# Patient Record
Sex: Male | Born: 1995 | Race: Black or African American | Hispanic: No | Marital: Single | Smoking: Current some day smoker
Health system: Southern US, Community
[De-identification: ages and names within clinical notes are randomized; demographics above are authoritative.]

## PROBLEM LIST (undated history)

## (undated) ENCOUNTER — Emergency Department (HOSPITAL_COMMUNITY): Disposition: A | Discharge status: Home / Self Care | Payer: Medicaid Other

## (undated) DIAGNOSIS — R42 Dizziness and giddiness: Secondary | ICD-10-CM

## (undated) DIAGNOSIS — R011 Cardiac murmur, unspecified: Secondary | ICD-10-CM

## (undated) DIAGNOSIS — R002 Palpitations: Secondary | ICD-10-CM

---
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
## 2017-01-17 ENCOUNTER — Emergency Department (HOSPITAL_COMMUNITY)
Admission: EM | Admit: 2017-01-17 | Discharge: 2017-01-17 | Disposition: A | Discharge status: Home / Self Care | Payer: Medicaid Other | Attending: Emergency Medicine | Admitting: Emergency Medicine

## 2017-01-17 ENCOUNTER — Encounter (HOSPITAL_COMMUNITY): Payer: Self-pay | Admitting: Emergency Medicine

## 2017-01-17 ENCOUNTER — Other Ambulatory Visit: Payer: Self-pay

## 2017-01-17 DIAGNOSIS — Z5321 Procedure and treatment not carried out due to patient leaving prior to being seen by health care provider: Secondary | ICD-10-CM | POA: Insufficient documentation

## 2017-01-17 DIAGNOSIS — H9202 Otalgia, left ear: Secondary | ICD-10-CM | POA: Insufficient documentation

## 2017-01-17 NOTE — ED Triage Notes (Signed)
Pt states that he is having lt otalgia x 2 days.  States that the earache is causing him to have lt sided head pain.  Denies any other issues.

## 2017-05-17 ENCOUNTER — Emergency Department (HOSPITAL_COMMUNITY): Payer: Medicaid Other

## 2017-05-17 ENCOUNTER — Encounter (HOSPITAL_COMMUNITY): Payer: Self-pay | Admitting: Emergency Medicine

## 2017-05-17 ENCOUNTER — Other Ambulatory Visit: Payer: Self-pay

## 2017-05-17 DIAGNOSIS — Z79899 Other long term (current) drug therapy: Secondary | ICD-10-CM | POA: Insufficient documentation

## 2017-05-17 DIAGNOSIS — F17200 Nicotine dependence, unspecified, uncomplicated: Secondary | ICD-10-CM | POA: Diagnosis not present

## 2017-05-17 DIAGNOSIS — R002 Palpitations: Secondary | ICD-10-CM | POA: Diagnosis present

## 2017-05-17 LAB — CBC
HCT: 45.5 % (ref 39.0–52.0)
Hemoglobin: 15.2 g/dL (ref 13.0–17.0)
MCH: 29 pg (ref 26.0–34.0)
MCHC: 33.4 g/dL (ref 30.0–36.0)
MCV: 86.8 fL (ref 78.0–100.0)
PLATELETS: 171 10*3/uL (ref 150–400)
RBC: 5.24 MIL/uL (ref 4.22–5.81)
RDW: 13 % (ref 11.5–15.5)
WBC: 12.6 10*3/uL — AB (ref 4.0–10.5)

## 2017-05-17 NOTE — ED Notes (Signed)
This Clinical research associatewriter stuck pt x2 was unsuccessful. RN Elsie RaJeneen made aware.

## 2017-05-17 NOTE — ED Notes (Signed)
Bed: [facility] Expected date:  Expected time:  Means of arrival:  Comments: 

## 2017-05-17 NOTE — ED Triage Notes (Signed)
Patient started feeling heart rate raising fast about 30 minutes ago. Patient is running sinus tach. No chest pain but feels like he is going to past out.

## 2017-05-18 ENCOUNTER — Emergency Department (HOSPITAL_COMMUNITY)
Admission: EM | Admit: 2017-05-18 | Discharge: 2017-05-18 | Disposition: A | Discharge status: Home / Self Care | Payer: Medicaid Other | Attending: Emergency Medicine | Admitting: Emergency Medicine

## 2017-05-18 DIAGNOSIS — R002 Palpitations: Secondary | ICD-10-CM

## 2017-05-18 LAB — BASIC METABOLIC PANEL
Anion gap: 7 (ref 5–15)
BUN: 11 mg/dL (ref 6–20)
CHLORIDE: 107 mmol/L (ref 101–111)
CO2: 27 mmol/L (ref 22–32)
CREATININE: 0.94 mg/dL (ref 0.61–1.24)
Calcium: 9.3 mg/dL (ref 8.9–10.3)
GFR calc Af Amer: >60 mL/min (ref >60)
GFR calc non Af Amer: >60 mL/min (ref >60)
Glucose, Bld: 110 mg/dL — ABNORMAL HIGH (ref 65–99)
Potassium: 3.7 mmol/L (ref 3.5–5.1)
SODIUM: 141 mmol/L (ref 135–145)

## 2017-05-18 LAB — I-STAT TROPONIN, ED: Troponin i, poc: 0.04 ng/mL (ref 0.00–0.08)

## 2017-05-18 NOTE — ED Provider Notes (Signed)
[facility]-EMERGENCY DEPT Provider Note   CSN: [id number] Arrival date & time: 05/17/17  2153     History   Chief Complaint Chief Complaint  Patient presents with  . Palpitations    HPI Todd Mcguire is a 22 y.o. male.  Patient presents to the emergency department with a chief complaint of palpitations.  He reports having had palpitations earlier today while playing a video game.  He states that they have since resolved.  He denies using any stimulants.  He states that he has not had anything like this in his recent past, but when he was younger he used to pass out while playing basketball.  He denies any chest pain or shortness of breath.  Denies any other associated symptoms.  States that he feels normal now.  The history is provided by the patient. No language interpreter was used.    Past Medical History:  Diagnosis Date  . Heart murmur     There are no active problems to display for this patient.   History reviewed. No pertinent surgical history.      Home Medications    Prior to Admission medications   Medication Sig Start Date End Date Taking? Authorizing Provider  dicyclomine (BENTYL) 20 MG tablet Take 1 tablet (20 mg total) by mouth 2 (two) times daily. For abdominal discomfort 11/06/15   Arthor Captain, PA-C  docusate sodium (COLACE) 100 MG capsule Take 1 capsule (100 mg total) by mouth every 12 (twelve) hours. Patient taking differently: Take 100 mg by mouth daily as needed for mild constipation.  09/29/15   Santiago Glad, PA-C  lactobacillus (FLORANEX/LACTINEX) PACK Take 1 packet (1 g total) by mouth 2 (two) times daily. For healthy abdominal bacteria. 11/06/15   Harris, Abigail, PA-C  polyethylene glycol powder (GLYCOLAX/MIRALAX) powder Take 17 g by mouth daily. Patient taking differently: Take 17 g by mouth daily as needed for mild constipation.  10/12/15   Deborha Payment, PA-C    Family History History reviewed. No pertinent  family history.  Social History Social History   Tobacco Use  . Smoking status: Current Every Day Smoker  . Smokeless tobacco: Never Used  Substance Use Topics  . Alcohol use: No  . Drug use: Yes    Types: Marijuana    Comment: occ     Allergies   Flexeril [cyclobenzaprine] and Ibuprofen   Review of Systems Review of Systems  All other systems reviewed and are negative.    Physical Exam Updated Vital Signs BP 140/77 (BP Location: Left Arm)   Pulse (!) 105   Temp 98.3 F (36.8 C) (Oral)   Resp 20   Ht 5\' 10"  (1.778 m)   Wt 58.1 kg (128 lb)   SpO2 100%   BMI 18.37 kg/m   Physical Exam  Constitutional: He is oriented to person, place, and time. He appears well-developed and well-nourished.  HENT:  Head: Normocephalic and atraumatic.  Eyes: Pupils are equal, round, and reactive to light. Conjunctivae and EOM are normal. Right eye exhibits no discharge. Left eye exhibits no discharge. No scleral icterus.  Neck: Normal range of motion. Neck supple. No JVD present.  Cardiovascular: Normal rate, regular rhythm and normal heart sounds. Exam reveals no gallop and no friction rub.  No murmur heard. Pulmonary/Chest: Effort normal and breath sounds normal. No respiratory distress. He has no wheezes. He has no rales. He exhibits no tenderness.  Abdominal: Soft. He exhibits no distension and no mass. There is  no tenderness. There is no rebound and no guarding.  Musculoskeletal: Normal range of motion. He exhibits no edema or tenderness.  Neurological: He is alert and oriented to person, place, and time.  Skin: Skin is warm and dry.  Psychiatric: He has a normal mood and affect. His behavior is normal. Judgment and thought content normal.  Nursing note and vitals reviewed.    ED Treatments / Results  Labs (all labs ordered are listed, but only abnormal results are displayed) Labs Reviewed  BASIC METABOLIC PANEL - Abnormal; Notable for the following components:       Result Value   Glucose, Bld 110 (*)    All other components within normal limits  CBC - Abnormal; Notable for the following components:   WBC 12.6 (*)    All other components within normal limits  I-STAT TROPONIN, ED    EKG None ED ECG REPORT  I personally interpreted this EKG   Date: 05/18/2017   Rate: 99  Rhythm: normal sinus rhythm  QRS Axis: normal  Intervals: normal  ST/T Wave abnormalities: normal  Conduction Disutrbances:none  Narrative Interpretation: possible right ventricular hypertrophy  Old EKG Reviewed: none available   Radiology Dg Chest 2 View  Result Date: 05/17/2017 CLINICAL DATA:  Palpitations.  Nausea. EXAM: CHEST - 2 VIEW COMPARISON:  09/29/2015 FINDINGS: The cardiomediastinal contours are normal. The lungs are clear. Pulmonary vasculature is normal. No consolidation, pleural effusion, or pneumothorax. No acute osseous abnormalities are seen. Previous rightward curvature of the spine has decreased in the interim. IMPRESSION: Unremarkable radiographs of the chest. Electronically Signed   By: Rubye OaksMelanie  Ehinger M.D.   On: 05/17/2017 23:21    Procedures Procedures (including critical care time)  Medications Ordered in ED Medications - No data to display   Initial Impression / Assessment and Plan / ED Course  I have reviewed the triage vital signs and the nursing notes.  Pertinent labs & imaging results that were available during my care of the patient were reviewed by me and considered in my medical decision making (see chart for details).     Patient with palpitations earlier this evening.  Symptoms have resolved.  Patient is symptom-free.  EKG shows sinus rhythm.  He is normal rate on the monitor.  He is not having any symptoms now.  Laboratory workup is reassuring.  He denies having used any stimulants today.  He did smoke marijuana.  He used to pass out when he was younger while playing basketball, but denies having had this in the recent past.  I have  encouraged the patient to avoid anything strenuous and have advised him to follow-up with cardiology as he may need to wear a heart monitor.  Patient understands and agrees the plan.  Family members also agree with the plan.  Final Clinical Impressions(s) / ED Diagnoses   Final diagnoses:  Palpitations    ED Discharge Orders    None       Roxy HorsemanBrowning, Shaneque Merkle, PA-C 05/18/17 0124    Molpus, Jonny RuizJohn, MD 05/18/17 0750    Paula LibraMolpus, John, MD 05/18/17 [phone]

## 2017-05-18 NOTE — Discharge Instructions (Addendum)
Do NOT do anything strenuous that might elevate your heart rate until you are seen by the cardiologist.  Return for worsening symptoms or if you pass out.

## 2017-05-22 ENCOUNTER — Encounter (HOSPITAL_COMMUNITY): Payer: Self-pay

## 2017-05-22 ENCOUNTER — Other Ambulatory Visit: Payer: Self-pay

## 2017-05-22 ENCOUNTER — Emergency Department (HOSPITAL_COMMUNITY)
Admission: EM | Admit: 2017-05-22 | Discharge: 2017-05-22 | Disposition: A | Discharge status: Home / Self Care | Payer: Medicaid Other | Attending: Emergency Medicine | Admitting: Emergency Medicine

## 2017-05-22 DIAGNOSIS — Z79899 Other long term (current) drug therapy: Secondary | ICD-10-CM | POA: Diagnosis not present

## 2017-05-22 DIAGNOSIS — F17200 Nicotine dependence, unspecified, uncomplicated: Secondary | ICD-10-CM | POA: Diagnosis not present

## 2017-05-22 DIAGNOSIS — K5900 Constipation, unspecified: Secondary | ICD-10-CM

## 2017-05-22 DIAGNOSIS — R011 Cardiac murmur, unspecified: Secondary | ICD-10-CM | POA: Insufficient documentation

## 2017-05-22 DIAGNOSIS — R002 Palpitations: Secondary | ICD-10-CM

## 2017-05-22 DIAGNOSIS — R42 Dizziness and giddiness: Secondary | ICD-10-CM | POA: Insufficient documentation

## 2017-05-22 HISTORY — DX: Palpitations: R00.2

## 2017-05-22 HISTORY — DX: Dizziness and giddiness: R42

## 2017-05-22 LAB — I-STAT CHEM 8, ED
BUN: 13 mg/dL (ref 6–20)
CREATININE: 0.9 mg/dL (ref 0.61–1.24)
Calcium, Ion: 1.2 mmol/L (ref 1.15–1.40)
Chloride: 103 mmol/L (ref 101–111)
Glucose, Bld: 84 mg/dL (ref 65–99)
HEMATOCRIT: 50 % (ref 39.0–52.0)
Hemoglobin: 17 g/dL (ref 13.0–17.0)
POTASSIUM: 3.9 mmol/L (ref 3.5–5.1)
Sodium: 142 mmol/L (ref 135–145)
TCO2: 29 mmol/L (ref 22–32)

## 2017-05-22 MED ORDER — POLYETHYLENE GLYCOL 3350 17 GM/SCOOP PO POWD: 17.0000 g | Freq: Every day | ORAL | 0 refills | Status: DC

## 2017-05-22 NOTE — ED Triage Notes (Signed)
He states that as he attempted to go to the bathroom this morning, he felt "dizzy". He is in no distress.

## 2017-05-22 NOTE — ED Provider Notes (Signed)
[facility]-EMERGENCY DEPT Provider Note   CSN: [id number] Arrival date & time: 05/22/17  4098     History   Chief Complaint Chief Complaint  Patient presents with  . Dizziness  . Constipation    HPI Todd Mcguire is a 22 y.o. male.  HPI Patient is a 22 year old male who states that when he got up for work this morning he felt somewhat lightheaded.  No preceding palpitations.  No chest pain or shortness of breath.  Denies syncope.  He states his been eating and drinking normally over the past several days.  He reports he feels constipated over the past several days.  He is tried an over-the-counter stool softener without improvement in his symptoms.  Currently he is asymptomatic while lying in the bed.  He has no complaints at this time.  He is able to stand at the edge of the bed without any lightheadedness at this time.  No melena or hematochezia.    Past Medical History:  Diagnosis Date  . Heart murmur     There are no active problems to display for this patient.   No past surgical history on file.      Home Medications    Prior to Admission medications   Medication Sig Start Date End Date Taking? Authorizing Provider  dicyclomine (BENTYL) 20 MG tablet Take 1 tablet (20 mg total) by mouth 2 (two) times daily. For abdominal discomfort 11/06/15  Yes Harris, Abigail, PA-C  docusate sodium (COLACE) 100 MG capsule Take 1 capsule (100 mg total) by mouth every 12 (twelve) hours. Patient taking differently: Take 100 mg by mouth daily as needed for mild constipation.  09/29/15  Yes Santiago Glad, PA-C  fluticasone (FLONASE) 50 MCG/ACT nasal spray Place 2 sprays into the nose daily as needed. 01/18/17 01/18/18 Yes [provider]  lactobacillus (FLORANEX/LACTINEX) PACK Take 1 packet (1 g total) by mouth 2 (two) times daily. For healthy abdominal bacteria. 11/06/15  Yes Harris, Abigail, PA-C  polyethylene glycol powder (MIRALAX) powder Take 17 g  by mouth daily. 05/22/17   Azalia Bilis, MD    Family History No family history on file.  Social History Social History   Tobacco Use  . Smoking status: Current Every Day Smoker  . Smokeless tobacco: Never Used  Substance Use Topics  . Alcohol use: No  . Drug use: Yes    Types: Marijuana    Comment: occ     Allergies   Flexeril [cyclobenzaprine] and Ibuprofen   Review of Systems Review of Systems  All other systems reviewed and are negative.    Physical Exam Updated Vital Signs BP 136/82 (BP Location: Right Arm)   Pulse 62   Temp (!) 97.4 F (36.3 C) (Oral)   Resp 18   SpO2 100%   Physical Exam  Constitutional: He is oriented to person, place, and time. He appears well-developed and well-nourished.  HENT:  Head: Normocephalic and atraumatic.  Eyes: EOM are normal.  Neck: Normal range of motion.  Cardiovascular: Normal rate, regular rhythm, normal heart sounds and intact distal pulses.  Pulmonary/Chest: Effort normal and breath sounds normal. No respiratory distress.  Abdominal: Soft. He exhibits no distension. There is no tenderness.  Musculoskeletal: Normal range of motion.  Neurological: He is alert and oriented to person, place, and time.  Skin: Skin is warm and dry.  Psychiatric: He has a normal mood and affect. Judgment normal.  Nursing note and vitals reviewed.    ED Treatments /  Results  Labs (all labs ordered are listed, but only abnormal results are displayed) Labs Reviewed  I-STAT CHEM 8, ED    EKG None  Radiology No results found.  Procedures Procedures (including critical care time)  Medications Ordered in ED Medications - No data to display   Initial Impression / Assessment and Plan / ED Course  I have reviewed the triage vital signs and the nursing notes.  Pertinent labs & imaging results that were available during my care of the patient were reviewed by me and considered in my medical decision making (see chart for  details).     Hemoglobin stable at 17.   Feels much better at this time.  Potassium normal.  Other electrolytes without abnormality.  Primary care follow-up.  No indication for additional workup or treatment at this time.  Vital signs are stable.  Final Clinical Impressions(s) / ED Diagnoses   Final diagnoses:  Lightheadedness  Constipation, unspecified constipation type    ED Discharge Orders        Ordered    polyethylene glycol powder (MIRALAX) powder  Daily     05/22/17 1015       Azalia Bilisampos, Zamya Culhane, MD 05/22/17 1019

## 2017-05-23 ENCOUNTER — Encounter (HOSPITAL_COMMUNITY): Payer: Self-pay | Admitting: Emergency Medicine

## 2017-05-23 ENCOUNTER — Other Ambulatory Visit: Payer: Self-pay

## 2017-05-23 ENCOUNTER — Telehealth (HOSPITAL_COMMUNITY): Payer: Self-pay

## 2017-05-23 ENCOUNTER — Ambulatory Visit (HOSPITAL_COMMUNITY)
Admission: EM | Admit: 2017-05-23 | Discharge: 2017-05-23 | Disposition: A | Discharge status: Home / Self Care | Payer: Medicaid Other | Attending: Family Medicine | Admitting: Family Medicine

## 2017-05-23 DIAGNOSIS — F17200 Nicotine dependence, unspecified, uncomplicated: Secondary | ICD-10-CM | POA: Diagnosis not present

## 2017-05-23 DIAGNOSIS — R531 Weakness: Secondary | ICD-10-CM

## 2017-05-23 DIAGNOSIS — Z79899 Other long term (current) drug therapy: Secondary | ICD-10-CM | POA: Diagnosis not present

## 2017-05-23 DIAGNOSIS — Z7951 Long term (current) use of inhaled steroids: Secondary | ICD-10-CM | POA: Insufficient documentation

## 2017-05-23 DIAGNOSIS — F1290 Cannabis use, unspecified, uncomplicated: Secondary | ICD-10-CM | POA: Diagnosis not present

## 2017-05-23 LAB — TSH: TSH: 0.711 u[IU]/mL (ref 0.350–4.500)

## 2017-05-23 NOTE — ED Triage Notes (Signed)
Patient was in emergency department for racing heart.   Today patient complains of feeling bad in general.  Feeling tired, denies any episodes of heart racing.  Reports stomach pain.  Patient reports good results with laxative.

## 2017-05-23 NOTE — ED Provider Notes (Signed)
[facility]    CSN: [id number] Arrival date & time: 05/23/17  1421     History   Chief Complaint Chief Complaint  Patient presents with  . Weakness    HPI Todd Mcguire is a 22 y.o. male.   22 year old male, with no significant past medical history, presenting today with his dad due to weakness.  Dad states that they have been evaluated twice in the emergency department in the past few days for similar symptoms.  Patient was evaluated for palpitations a few days ago as well as lightheadedness yesterday.  Had reassuring work-ups both times with normal EKGs and labs.  Patient had an i-STAT done yesterday with normal electrolytes.  He does admit to smoking marijuana prior to his palpitations beginning during his previous ER visit.  Reports generalized weakness today.  Denies any complaints of pain, palpitations.  Has an appointment scheduled for primary care in the next month.  The history is provided by the patient.  Weakness  Primary symptoms include dizziness.  Primary symptoms include no focal weakness, no loss of balance, no speech change, no memory loss, no movement disorder. This is a new problem. The current episode started more than 2 days ago. The problem has not changed since onset.There was no focality noted. There has been no fever. Pertinent negatives include no shortness of breath, no chest pain, no vomiting, no altered mental status, no confusion and no headaches. There were no medications administered prior to arrival. Associated medical issues do not include trauma, mood changes, a bleeding disorder, seizures, dementia, CVA or a clotting disorder.    Past Medical History:  Diagnosis Date  . Dizziness 05/22/2017  . Heart murmur   . Palpitations 05/22/2017    Patient Active Problem List   Diagnosis Date Noted  . Palpitations 05/22/2017  . Dizziness 05/22/2017  . Heart murmur     History reviewed. No pertinent surgical history.     Home Medications     Prior to Admission medications   Medication Sig Start Date End Date Taking? Authorizing Provider  dicyclomine (BENTYL) 20 MG tablet Take 1 tablet (20 mg total) by mouth 2 (two) times daily. For abdominal discomfort 11/06/15   Arthor CaptainHarris, Abigail, PA-C  docusate sodium (COLACE) 100 MG capsule Take 1 capsule (100 mg total) by mouth every 12 (twelve) hours. Patient taking differently: Take 100 mg by mouth daily as needed for mild constipation.  09/29/15   Santiago GladLaisure, Heather, PA-C  fluticasone (FLONASE) 50 MCG/ACT nasal spray Place 2 sprays into the nose daily as needed. 01/18/17 01/18/18  [provider]  lactobacillus (FLORANEX/LACTINEX) PACK Take 1 packet (1 g total) by mouth 2 (two) times daily. For healthy abdominal bacteria. 11/06/15   Harris, Abigail, PA-C  polyethylene glycol powder (MIRALAX) powder Take 17 g by mouth daily. 05/22/17   Azalia Bilisampos, Kevin, MD    Family History No family history on file.  Social History Social History   Tobacco Use  . Smoking status: Current Every Day Smoker  . Smokeless tobacco: Never Used  Substance Use Topics  . Alcohol use: No  . Drug use: Yes    Types: Marijuana    Comment: occ     Allergies   Flexeril [cyclobenzaprine] and Ibuprofen   Review of Systems Review of Systems  Constitutional: Negative for chills and fever.  HENT: Negative for ear pain and sore throat.   Eyes: Negative for pain and visual disturbance.  Respiratory: Negative for cough and shortness of breath.  Cardiovascular: Negative for chest pain and palpitations.  Gastrointestinal: Negative for abdominal pain and vomiting.  Genitourinary: Negative for dysuria and hematuria.  Musculoskeletal: Negative for arthralgias and back pain.  Skin: Negative for color change and rash.  Neurological: Positive for dizziness, weakness and light-headedness. Negative for speech change, focal weakness, seizures, syncope, headaches and loss of balance.  Psychiatric/Behavioral: Negative for  confusion and memory loss.  All other systems reviewed and are negative.    Physical Exam Triage Vital Signs ED Triage Vitals  Enc Vitals Group     BP 05/23/17 1444 124/69     Pulse Rate 05/23/17 1444 (!) 57     Resp 05/23/17 1444 18     Temp 05/23/17 1444 98.7 F (37.1 C)     Temp Source 05/23/17 1444 Oral     SpO2 05/23/17 1444 100 %     Weight --      Height --      Head Circumference --      Peak Flow --      Pain Score 05/23/17 1443 5     Pain Loc --      Pain Edu? --      Excl. in GC? --    No data found.  Updated Vital Signs BP 124/69 (BP Location: Left Arm)   Pulse (!) 57   Temp 98.7 F (37.1 C) (Oral)   Resp 18   SpO2 100%   Visual Acuity Right Eye Distance:   Left Eye Distance:   Bilateral Distance:    Right Eye Near:   Left Eye Near:    Bilateral Near:     Physical Exam  Constitutional: He is oriented to person, place, and time. He appears well-developed and well-nourished.  HENT:  Head: Normocephalic and atraumatic.  Eyes: Conjunctivae are normal.  Neck: Neck supple.  Cardiovascular: Normal rate and regular rhythm.  No murmur heard. Pulmonary/Chest: Effort normal and breath sounds normal. No respiratory distress.  Abdominal: Soft. There is no tenderness.  Musculoskeletal: He exhibits no edema.       Lumbar back: He exhibits tenderness.  Neurological: He is alert and oriented to person, place, and time. He has normal strength and normal reflexes. No cranial nerve deficit or sensory deficit. He displays a negative Romberg sign. GCS eye subscore is 4. GCS verbal subscore is 5. GCS motor subscore is 6.  Skin: Skin is warm and dry.  Psychiatric: He has a normal mood and affect.  Nursing note and vitals reviewed.    UC Treatments / Results  Labs (all labs ordered are listed, but only abnormal results are displayed) Labs Reviewed  TSH    EKG None Radiology No results found.  Procedures Procedures (including critical care  time)  Medications Ordered in UC Medications - No data to display   Initial Impression / Assessment and Plan / UC Course  I have reviewed the triage vital signs and the nursing notes.  Pertinent labs & imaging results that were available during my care of the patient were reviewed by me and considered in my medical decision making (see chart for details).     Reviewed patient's labs and work-up from his previous 2 ED visits.  TSH added.  Recommended that he follow-up with his primary care physician at his upcoming scheduled appointment.  Recommended increase oral intake of fluids and rest.  Symptoms likely related to recent substance abuse.  Final Clinical Impressions(s) / UC Diagnoses   Final diagnoses:  Weakness  ED Discharge Orders    None       Controlled Substance Prescriptions [place] Controlled Substance Registry consulted? Not Applicable   Alecia Lemming, [place] 05/23/17 1514

## 2017-05-23 NOTE — Telephone Encounter (Signed)
Results are within normal range. Attempted to reach patient. No answer.

## 2017-05-24 ENCOUNTER — Encounter (HOSPITAL_COMMUNITY): Payer: Self-pay | Admitting: Emergency Medicine

## 2017-05-24 ENCOUNTER — Emergency Department (HOSPITAL_COMMUNITY)
Admission: EM | Admit: 2017-05-24 | Discharge: 2017-05-24 | Disposition: A | Discharge status: Home / Self Care | Payer: Medicaid Other | Attending: Emergency Medicine | Admitting: Emergency Medicine

## 2017-05-24 DIAGNOSIS — R1030 Lower abdominal pain, unspecified: Secondary | ICD-10-CM | POA: Insufficient documentation

## 2017-05-24 DIAGNOSIS — R5383 Other fatigue: Secondary | ICD-10-CM | POA: Insufficient documentation

## 2017-05-24 DIAGNOSIS — F17210 Nicotine dependence, cigarettes, uncomplicated: Secondary | ICD-10-CM | POA: Insufficient documentation

## 2017-05-24 DIAGNOSIS — M549 Dorsalgia, unspecified: Secondary | ICD-10-CM | POA: Insufficient documentation

## 2017-05-24 DIAGNOSIS — R109 Unspecified abdominal pain: Secondary | ICD-10-CM

## 2017-05-24 LAB — COMPREHENSIVE METABOLIC PANEL
ALT: 11 U/L — AB (ref 17–63)
AST: 14 U/L — ABNORMAL LOW (ref 15–41)
Albumin: 4.5 g/dL (ref 3.5–5.0)
Alkaline Phosphatase: 82 U/L (ref 38–126)
Anion gap: 9 (ref 5–15)
BILIRUBIN TOTAL: 1.1 mg/dL (ref 0.3–1.2)
BUN: 12 mg/dL (ref 6–20)
CHLORIDE: 105 mmol/L (ref 101–111)
CO2: 25 mmol/L (ref 22–32)
CREATININE: 0.88 mg/dL (ref 0.61–1.24)
Calcium: 9.3 mg/dL (ref 8.9–10.3)
GFR calc non Af Amer: >60 mL/min (ref >60)
Glucose, Bld: 89 mg/dL (ref 65–99)
POTASSIUM: 3.9 mmol/L (ref 3.5–5.1)
Sodium: 139 mmol/L (ref 135–145)
TOTAL PROTEIN: 7.8 g/dL (ref 6.5–8.1)

## 2017-05-24 LAB — CBC
HCT: 47.9 % (ref 39.0–52.0)
Hemoglobin: 16.1 g/dL (ref 13.0–17.0)
MCH: 29.1 pg (ref 26.0–34.0)
MCHC: 33.6 g/dL (ref 30.0–36.0)
MCV: 86.6 fL (ref 78.0–100.0)
Platelets: 200 10*3/uL (ref 150–400)
RBC: 5.53 MIL/uL (ref 4.22–5.81)
RDW: 12.8 % (ref 11.5–15.5)
WBC: 6.7 10*3/uL (ref 4.0–10.5)

## 2017-05-24 LAB — RAPID URINE DRUG SCREEN, HOSP PERFORMED
Amphetamines: NOT DETECTED
Barbiturates: NOT DETECTED
Benzodiazepines: NOT DETECTED
Cocaine: NOT DETECTED
OPIATES: NOT DETECTED
Tetrahydrocannabinol: NOT DETECTED

## 2017-05-24 LAB — ETHANOL

## 2017-05-24 LAB — SALICYLATE LEVEL

## 2017-05-24 LAB — LIPASE, BLOOD: LIPASE: 35 U/L (ref 11–51)

## 2017-05-24 LAB — ACETAMINOPHEN LEVEL: Acetaminophen (Tylenol), Serum: <10 ug/mL — ABNORMAL LOW (ref 10–30)

## 2017-05-24 LAB — PROTIME-INR
INR: 1.04
PROTHROMBIN TIME: 13.5 s (ref 11.4–15.2)

## 2017-05-24 NOTE — ED Provider Notes (Signed)
[facility]-EMERGENC[facility] DEPT Provider Note   CSN: [id number] Arrival date & time: 05/24/17  1541     History   Chief Complaint Chief Complaint  Patient presents with  . Fatigue  . Constipation  . Abdominal Pain  . Back Pain    HPI JOURDON ZIMMERLE is a 22 y.o. male.  HPI Pt  States he has been having trouble with fatigue, constipation, abdominal pain and back pain.  Patient was initially seen in the emergency room on Friday for heart palpitations after smoking marijuana.  Since that time the patient's had trouble with constipation, lower abdominal pain and also some back pain.  Patient was seen in urgent care on the 18th.  Patient had a negative workup.  Patient presents back to the ED today because of persistent symptoms.  Family was also concerned that the marijuana may have been poisoned.  Specifically they are concerned about rat poisoning.  He has not had any nausea or vomiting.  He denies any diarrhea.  He does have some trouble with constipation.  No dysuria. Past Medical History:  Diagnosis Date  . Dizziness 05/22/2017  . Heart murmur   . Palpitations 05/22/2017    Patient Active Problem List   Diagnosis Date Noted  . Palpitations 05/22/2017  . Dizziness 05/22/2017  . Heart murmur     History reviewed. No pertinent surgical history.      Home Medications    Prior to Admission medications   Medication Sig Start Date End Date Taking? Authorizing Provider  polyethylene glycol powder (MIRALAX) powder Take 17 g by mouth daily. 05/22/17  Yes Azalia Bilis, MD  dicyclomine (BENTYL) 20 MG tablet Take 1 tablet (20 mg total) by mouth 2 (two) times daily. For abdominal discomfort Patient not taking: Reported on 05/24/2017 11/06/15   Arthor Captain, PA-C  docusate sodium (COLACE) 100 MG capsule Take 1 capsule (100 mg total) by mouth every 12 (twelve) hours. Patient not taking: Reported on 05/24/2017 09/29/15   Santiago Glad, PA-C  lactobacillus  (FLORANEX/LACTINEX) PACK Take 1 packet (1 g total) by mouth 2 (two) times daily. For healthy abdominal bacteria. Patient not taking: Reported on 05/24/2017 11/06/15   Arthor Captain, PA-C    Family History No family history on file.  Social History Social History   Tobacco Use  . Smoking status: Current Every Day Smoker  . Smokeless tobacco: Never Used  Substance Use Topics  . Alcohol use: No  . Drug use: Yes    Types: Marijuana    Comment: occ     Allergies   Flexeril [cyclobenzaprine] and Ibuprofen   Review of Systems Review of Systems  All other systems reviewed and are negative.    Physical Exam Updated Vital Signs BP 128/78 (BP Location: Left Arm)   Pulse 63   Temp 98.1 F (36.7 C) (Oral)   Resp 18   SpO2 100%   Physical Exam  Constitutional: He appears well-developed and well-nourished. No distress.  HENT:  Head: Normocephalic and atraumatic.  Right Ear: External ear normal.  Left Ear: External ear normal.  Eyes: Conjunctivae are normal. Right eye exhibits no discharge. Left eye exhibits no discharge. No scleral icterus.  Neck: Neck supple. No tracheal deviation present.  Cardiovascular: Normal rate, regular rhythm and intact distal pulses.  Pulmonary/Chest: Effort normal and breath sounds normal. No stridor. No respiratory distress. He has no wheezes. He has no rales.  Abdominal: Soft. Bowel sounds are normal. He exhibits no distension. There is no tenderness.  There is no rebound and no guarding.  Musculoskeletal: He exhibits no edema or tenderness.  Neurological: He is alert. He has normal strength. No cranial nerve deficit (no facial droop, extraocular movements intact, no slurred speech) or sensory deficit. He exhibits normal muscle tone. He displays no seizure activity. Coordination normal.  Skin: Skin is warm and dry. No rash noted.  Psychiatric: He has a normal mood and affect.  Nursing note and vitals reviewed.    ED Treatments / Results   Labs (all labs ordered are listed, but only abnormal results are displayed) Labs Reviewed  COMPREHENSIVE METABOLIC PANEL - Abnormal; Notable for the following components:      Result Value   AST 14 (*)    ALT 11 (*)    All other components within normal limits  ACETAMINOPHEN LEVEL - Abnormal; Notable for the following components:   Acetaminophen (Tylenol), Serum <10 (*)    All other components within normal limits  ETHANOL  SALICYLATE LEVEL  CBC  RAPID URINE DRUG SCREEN, HOSP PERFORMED  LIPASE, BLOOD  PROTIME-INR     Procedures Procedures (including critical care time)  Medications Ordered in ED Medications - No data to display   Initial Impression / Assessment and Plan / ED Course  I have reviewed the triage vital signs and the nursing notes.  Pertinent labs & imaging results that were available during my care of the patient were reviewed by me and considered in my medical decision making (see chart for details).   Patient's laboratory tests are reassuring.  His abdominal exam is benign.  His INR is normal although marijuana would not likely be laced with the rat poisoning and I am not sure that smoking it would cause typical findings seen with ingestions.  Patient may be having some anxiety with his recent use of marijuana.  Recommend follow-up with a primary care doctor.  Final Clinical Impressions(s) / ED Diagnoses   Final diagnoses:  Abdominal pain, unspecified abdominal location    ED Discharge Orders    None       Linwood DibblesKnapp, Zalia Hautala, MD 05/24/17 2119

## 2017-05-24 NOTE — ED Notes (Signed)
Unable to collect blue top I made the nurse aware.  Patient still needs blue top

## 2017-05-24 NOTE — ED Triage Notes (Signed)
Pt was seen here last Friday for heart palpitations after smoking weed.  Pt been having fatigue, constipation, lower abd pains, back pains since. Family asking if we can check if patient been poisoned by rat poisoning. Spoke with Dr Silverio LayYao and Issacs, verbal orders to due SI standing protocol, lipase with abd pains and PT/INR.

## 2017-05-24 NOTE — Discharge Instructions (Signed)
Follow-up with your primary care doctor. Continue your current medications. °

## 2017-05-27 NOTE — Progress Notes (Deleted)
Cardiology Office Note:    Date:  05/27/2017   ID:  Todd RodDreasean C Schwertner, DOB 10-17-1995, MRN [id number]  PCP:  Patient, No Pcp Per  Cardiologist:  Norman HerrlichBrian Munley, MD   Referring MD: No ref. provider found  ASSESSMENT:    1. Palpitations    PLAN:    In order of problems listed above:  1. ***  Next appointment   Medication Adjustments/Labs and Tests Ordered: Current medicines are reviewed at length with the patient today.  Concerns regarding medicines are outlined above.  No orders of the defined types were placed in this encounter.  No orders of the defined types were placed in this encounter.    No chief complaint on file. ***  History of Present Illness:    Todd Mcguire is a 22 y.o. male who is being seen today for the evaluation of palpitation after ED visit 05/18/17 at the request ofMolpus, Jonny RuizJohn, MD . He has had 3 subsequent ED visits for lightheadedness, weakness and abdominal pain   Initial Impression / Assessment and Plan / ED Course  I have reviewed the triage vital signs and the nursing notes. Pertinent labs & imaging results that were available during my care of the patient were reviewed by me and considered in my medical decision making (see chart for details). Patient with palpitations earlier this evening.  Symptoms have resolved.  Patient is symptom-free.  EKG shows sinus rhythm.  He is normal rate on the monitor.  He is not having any symptoms now.  Laboratory workup is reassuring.  He denies having used any stimulants today.  He did smoke marijuana.  He used to pass out when he was younger while playing basketball, but denies having had this in the recent past.  I have encouraged the patient to avoid anything strenuous and have advised him to follow-up with cardiology as he may need to wear a heart monitor.  Patient understands and agrees the plan.  Family members also agree with the plan.  Final Clinical Impressions(s) / ED Diagnoses   Final diagnoses:    Palpitations     Past Medical History:  Diagnosis Date  . Dizziness 05/22/2017  . Heart murmur   . Palpitations 05/22/2017    No past surgical history on file.  Current Medications: No outpatient medications have been marked as taking for the 05/28/17 encounter (Appointment) with Baldo DaubMunley, Brian J, MD.     Allergies:   Flexeril [cyclobenzaprine] and Ibuprofen   Social History   Socioeconomic History  . Marital status: Single    Spouse name: Not on file  . Number of children: Not on file  . Years of education: Not on file  . Highest education level: Not on file  Occupational History  . Not on file  Social Needs  . Financial resource strain: Not on file  . Food insecurity:    Worry: Not on file    Inability: Not on file  . Transportation needs:    Medical: Not on file    Non-medical: Not on file  Tobacco Use  . Smoking status: Current Every Day Smoker  . Smokeless tobacco: Never Used  Substance and Sexual Activity  . Alcohol use: No  . Drug use: Yes    Types: Marijuana    Comment: occ  . Sexual activity: Never  Lifestyle  . Physical activity:    Days per week: Not on file    Minutes per session: Not on file  . Stress: Not on file  Relationships  . Social connections:    Talks on phone: Not on file    Gets together: Not on file    Attends religious service: Not on file    Active member of club or organization: Not on file    Attends meetings of clubs or organizations: Not on file    Relationship status: Not on file  Other Topics Concern  . Not on file  Social History Narrative  . Not on file     Family History: The patient's ***family history is not on file.  ROS:   ROS Please see the history of present illness.    *** All other systems reviewed and are negative.  EKGs/Labs/Other Studies Reviewed:    The following studies were reviewed today: ***  EKG: EKG Interpretation  Date/Time:                        Friday May 17 2017 22:19:38  EDT Ventricular Rate:   99 PR Interval:                        QRS Duration:        99 QT Interval:                      341 QTC Calculation:    438 R Axis:                         93 Text Interpretation:  Sinus rhythm Consider right ventricular hypertrophy Rate is faster Confirmed by Molpus, John ([id number]) on 05/18/2017 7:50:49 AM  Recent Labs: 05/23/2017: TSH 0.711 05/24/2017: ALT 11; BUN 12; Creatinine, Ser 0.88; Hemoglobin 16.1; Platelets 200; Potassium 3.9; Sodium 139  Recent Lipid Panel No results found for: CHOL, TRIG, HDL, CHOLHDL, VLDL, LDLCALC, LDLDIRECT  Physical Exam:    VS:  There were no vitals taken for this visit.    Wt Readings from Last 3 Encounters:  05/17/17 128 lb (58.1 kg)  07/23/16 120 lb (54.4 kg) (4 %, Z= -1.76)*  10/12/15 117 lb (53.1 kg) (3 %, Z= -1.82)*   * Growth percentiles are based on CDC (Boys, 2-20 Years) data.     GEN: *** Well nourished, well developed in no acute distress HEENT: Normal NECK: No JVD; No carotid bruits LYMPHATICS: No lymphadenopathy CARDIAC: ***RRR, no murmurs, rubs, gallops RESPIRATORY:  Clear to auscultation without rales, wheezing or rhonchi  ABDOMEN: Soft, non-tender, non-distended MUSCULOSKELETAL:  No edema; No deformity  SKIN: Warm and dry NEUROLOGIC:  Alert and oriented x 3 PSYCHIATRIC:  Normal affect     Signed, Norman Herrlich, MD  05/27/2017 4:32 PM    [facility]

## 2017-05-28 ENCOUNTER — Ambulatory Visit: Payer: Medicaid Other | Admitting: Cardiology

## 2017-05-30 ENCOUNTER — Encounter: Payer: Self-pay | Admitting: Cardiology

## 2017-06-24 ENCOUNTER — Ambulatory Visit (INDEPENDENT_AMBULATORY_CARE_PROVIDER_SITE_OTHER): Payer: Medicaid Other | Admitting: Nurse Practitioner

## 2017-07-30 ENCOUNTER — Ambulatory Visit (INDEPENDENT_AMBULATORY_CARE_PROVIDER_SITE_OTHER): Payer: Medicaid Other | Admitting: Physician Assistant

## 2017-08-17 ENCOUNTER — Ambulatory Visit (HOSPITAL_COMMUNITY)
Admission: EM | Admit: 2017-08-17 | Discharge: 2017-08-17 | Disposition: A | Discharge status: Home / Self Care | Payer: Medicaid Other | Attending: Internal Medicine | Admitting: Internal Medicine

## 2017-08-17 ENCOUNTER — Encounter (HOSPITAL_COMMUNITY): Payer: Self-pay | Admitting: Emergency Medicine

## 2017-08-17 DIAGNOSIS — R002 Palpitations: Secondary | ICD-10-CM | POA: Insufficient documentation

## 2017-08-17 DIAGNOSIS — Z886 Allergy status to analgesic agent status: Secondary | ICD-10-CM | POA: Insufficient documentation

## 2017-08-17 DIAGNOSIS — R011 Cardiac murmur, unspecified: Secondary | ICD-10-CM | POA: Insufficient documentation

## 2017-08-17 DIAGNOSIS — Z202 Contact with and (suspected) exposure to infections with a predominantly sexual mode of transmission: Secondary | ICD-10-CM | POA: Diagnosis not present

## 2017-08-17 DIAGNOSIS — F17200 Nicotine dependence, unspecified, uncomplicated: Secondary | ICD-10-CM | POA: Insufficient documentation

## 2017-08-17 DIAGNOSIS — R369 Urethral discharge, unspecified: Secondary | ICD-10-CM

## 2017-08-17 DIAGNOSIS — Z113 Encounter for screening for infections with a predominantly sexual mode of transmission: Secondary | ICD-10-CM

## 2017-08-17 DIAGNOSIS — R42 Dizziness and giddiness: Secondary | ICD-10-CM | POA: Diagnosis not present

## 2017-08-17 MED ORDER — AZITHROMYCIN 250 MG PO TABS
ORAL_TABLET | ORAL | Status: AC
  Filled 2017-08-17: qty 4

## 2017-08-17 MED ORDER — LIDOCAINE HCL (PF) 1 % IJ SOLN
INTRAMUSCULAR | Status: AC
  Filled 2017-08-17: qty 2

## 2017-08-17 MED ORDER — CEFTRIAXONE SODIUM 250 MG IJ SOLR
INTRAMUSCULAR | Status: AC
  Filled 2017-08-17: qty 250

## 2017-08-17 MED ORDER — CEFTRIAXONE SODIUM 250 MG IJ SOLR
250.0000 mg | Freq: Once | INTRAMUSCULAR | Status: AC
  Administered 2017-08-17: 250 mg via INTRAMUSCULAR

## 2017-08-17 MED ORDER — AZITHROMYCIN 250 MG PO TABS
1000.0000 mg | ORAL_TABLET | Freq: Once | ORAL | Status: AC
  Administered 2017-08-17: 1000 mg via ORAL

## 2017-08-17 NOTE — Discharge Instructions (Signed)
We have treated you today for gonorrhea and chlamydia, with azithromycin and rocephin. Please refrain from sexual activity for 7 days while medicine is clearing infection.  We are testing you for Gonorrhea, Chlamydia and Trichomonas. We will call you if anything is positive and let you know if you require any further treatment. Please inform partner of any positive results.  Please return if symptoms not improving with treatment, development of fever, nausea, vomiting, abdominal pain, scrotal pain.

## 2017-08-17 NOTE — ED Provider Notes (Signed)
[facility]    CSN: [id number] Arrival date & time: 08/17/17  1245     History   Chief Complaint Chief Complaint  Patient presents with  . SEXUALLY TRANSMITTED DISEASE    HPI Todd Mcguire is a [age] y.o. male no contributing past medical history presenting today for evaluation of penile discharge.  Patient states that he has had discharge off and on for the past week.  He denies dysuria, increased frequency or abdominal pain.  Denies scrotal swelling or testicular pain.  Denies any rashes or lesions.  He denies any known positive exposures to STDs.  HPI  Past Medical History:  Diagnosis Date  . Dizziness 05/22/2017  . Heart murmur   . Palpitations 05/22/2017    Patient Active Problem List   Diagnosis Date Noted  . Palpitations 05/22/2017  . Dizziness 05/22/2017  . Heart murmur     History reviewed. No pertinent surgical history.     Home Medications    Prior to Admission medications   Not on File    Family History No family history on file.  Social History Social History   Tobacco Use  . Smoking status: Current Every Day Smoker  . Smokeless tobacco: Never Used  Substance Use Topics  . Alcohol use: No  . Drug use: Yes    Types: Marijuana    Comment: occ     Allergies   Flexeril [cyclobenzaprine] and Ibuprofen   Review of Systems Review of Systems  Constitutional: Negative for fever.  HENT: Negative for sore throat.   Respiratory: Negative for shortness of breath.   Cardiovascular: Negative for chest pain.  Gastrointestinal: Negative for abdominal pain, nausea and vomiting.  Genitourinary: Positive for discharge. Negative for difficulty urinating, dysuria, frequency, penile pain, penile swelling, scrotal swelling and testicular pain.  Skin: Negative for rash.  Neurological: Negative for dizziness, light-headedness and headaches.     Physical Exam Triage Vital Signs ED Triage Vitals  Enc Vitals Group     BP 08/17/17 1409 116/66      Pulse Rate 08/17/17 1409 (!) 58     Resp 08/17/17 1409 16     Temp 08/17/17 1409 98.2 F (36.8 C)     Temp src --      SpO2 08/17/17 1409 100 %     Weight --      Height --      Head Circumference --      Peak Flow --      Pain Score 08/17/17 1410 0     Pain Loc --      Pain Edu? --      Excl. in GC? --    No data found.  Updated Vital Signs BP 116/66   Pulse (!) 58   Temp 98.2 F (36.8 C)   Resp 16   SpO2 100%   Visual Acuity Right Eye Distance:   Left Eye Distance:   Bilateral Distance:    Right Eye Near:   Left Eye Near:    Bilateral Near:     Physical Exam  Constitutional: He is oriented to person, place, and time. He appears well-developed and well-nourished.  No acute distress  HENT:  Head: Normocephalic and atraumatic.  Nose: Nose normal.  Eyes: Conjunctivae are normal.  Neck: Neck supple.  Cardiovascular: Normal rate.  Pulmonary/Chest: Effort normal. No respiratory distress.  Abdominal: He exhibits no distension.  Nontender to light and deep palpation throughout all 4 quadrants  Genitourinary:  Genitourinary Comments: Deferred  Musculoskeletal: Normal range of motion.  Neurological: He is alert and oriented to person, place, and time.  Skin: Skin is warm and dry.  Psychiatric: He has a normal mood and affect.  Nursing note and vitals reviewed.    UC Treatments / Results  Labs (all labs ordered are listed, but only abnormal results are displayed) Labs Reviewed  URINE CYTOLOGY ANCILLARY ONLY    EKG None  Radiology No results found.  Procedures Procedures (including critical care time)  Medications Ordered in UC Medications  cefTRIAXone (ROCEPHIN) injection 250 mg (has no administration in time range)  azithromycin (ZITHROMAX) tablet 1,000 mg (has no administration in time range)    Initial Impression / Assessment and Plan / UC Course  I have reviewed the triage vital signs and the nursing notes.  Pertinent labs & imaging  results that were available during my care of the patient were reviewed by me and considered in my medical decision making (see chart for details).     Patient with penile discharge, likely STD.  We will go ahead and empirically treat with Rocephin and azithromycin for gonorrhea and chlamydia.  Urine cytology obtained and will send off for further evaluation.  Will call patient with results and alter treatment as needed.Discussed strict return precautions. Patient verbalized understanding and is agreeable with plan.  Final Clinical Impressions(s) / UC Diagnoses   Final diagnoses:  Penile discharge     Discharge Instructions     We have treated you today for gonorrhea and chlamydia, with azithromycin and rocephin. Please refrain from sexual activity for 7 days while medicine is clearing infection.  We are testing you for Gonorrhea, Chlamydia and Trichomonas. We will call you if anything is positive and let you know if you require any further treatment. Please inform partner of any positive results.  Please return if symptoms not improving with treatment, development of fever, nausea, vomiting, abdominal pain, scrotal pain.   ED Prescriptions    None     Controlled Substance Prescriptions [place] Controlled Substance Registry consulted? Not Applicable   Lew Dawes, [place] 08/17/17 1502

## 2017-08-17 NOTE — ED Triage Notes (Signed)
Pt states he noticed discharge, requesting std medications.

## 2017-08-19 ENCOUNTER — Telehealth (HOSPITAL_COMMUNITY): Payer: Self-pay

## 2017-08-19 LAB — URINE CYTOLOGY ANCILLARY ONLY
Chlamydia: POSITIVE — AB
NEISSERIA GONORRHEA: NEGATIVE
TRICH (WINDOWPATH): NEGATIVE

## 2017-08-19 NOTE — Telephone Encounter (Signed)
Chlamydia is positive.  This was treated at the urgent care visit with po zithromax 1g.  Pt contacted and made aware of results. Educated pt to please refrain from sexual intercourse for 7 days to give the medicine time to work.  Sexual partners need to be notified and tested/treated.  Condoms may reduce risk of reinfection.  Recheck or followup with PCP for further evaluation if symptoms are not improving.  [facility] notified 

## 2017-08-20 ENCOUNTER — Ambulatory Visit (INDEPENDENT_AMBULATORY_CARE_PROVIDER_SITE_OTHER): Payer: Medicaid Other | Admitting: Physician Assistant

## 2018-01-19 ENCOUNTER — Ambulatory Visit (HOSPITAL_COMMUNITY)
Admission: EM | Admit: 2018-01-19 | Discharge: 2018-01-19 | Disposition: A | Discharge status: Home / Self Care | Payer: Medicaid Other | Attending: Internal Medicine | Admitting: Internal Medicine

## 2018-01-19 ENCOUNTER — Encounter (HOSPITAL_COMMUNITY): Payer: Self-pay | Admitting: *Deleted

## 2018-01-19 ENCOUNTER — Ambulatory Visit (INDEPENDENT_AMBULATORY_CARE_PROVIDER_SITE_OTHER): Payer: Medicaid Other

## 2018-01-19 DIAGNOSIS — S99912A Unspecified injury of left ankle, initial encounter: Secondary | ICD-10-CM | POA: Diagnosis not present

## 2018-01-19 DIAGNOSIS — S93402A Sprain of unspecified ligament of left ankle, initial encounter: Secondary | ICD-10-CM | POA: Diagnosis not present

## 2018-01-19 NOTE — Discharge Instructions (Addendum)
Brace on your ankle. Patient you are resting, icing and elevating the ankle You can take ibuprofen as needed for pain inflammation Follow up as needed for continued or worsening symptoms

## 2018-01-19 NOTE — ED Triage Notes (Signed)
Reports tripping while going up stairs approx 2 wks ago, rolling left ankle.  C/o continued pain to left lateral ankle.  Pt ambulates with limp.

## 2018-01-20 NOTE — ED Provider Notes (Signed)
[facility]    CSN: [id number] Arrival date & time: 01/19/18  1134     History   Chief Complaint Chief Complaint  Patient presents with  . Ankle Injury    HPI Leo RodDreasean C Goto is a 22 y.o. male.   Pt is a healthy 22 year old male that presents with left lateral ankle pain and swelling that has been constant over the past 2 weeks. This started after falling and landing on the ankle. He has been able to ambulate with a limp. He has been taking ibuprofen intermittently for the pain. Some icing in the beginning. Denies any numbness, tingling, radiation of pain.   ROS per HPI      Past Medical History:  Diagnosis Date  . Dizziness 05/22/2017  . Heart murmur   . Palpitations 05/22/2017    Patient Active Problem List   Diagnosis Date Noted  . Palpitations 05/22/2017  . Dizziness 05/22/2017  . Heart murmur     History reviewed. No pertinent surgical history.     Home Medications    Prior to Admission medications   Not on File    Family History Family History  Problem Relation Age of Onset  . Healthy Mother   . Healthy Father     Social History Social History   Tobacco Use  . Smoking status: Current Some Day Smoker    Types: Cigars  . Smokeless tobacco: Never Used  Substance Use Topics  . Alcohol use: Yes    Comment: occasionally  . Drug use: Not Currently    Types: Marijuana     Allergies   Flexeril [cyclobenzaprine] and Ibuprofen   Review of Systems Review of Systems   Physical Exam Triage Vital Signs ED Triage Vitals  Enc Vitals Group     BP 01/19/18 1220 134/63     Pulse Rate 01/19/18 1220 60     Resp 01/19/18 1220 16     Temp 01/19/18 1220 97.6 F (36.4 C)     Temp Source 01/19/18 1220 Oral     SpO2 01/19/18 1220 100 %     Weight --      Height --      Head Circumference --      Peak Flow --      Pain Score 01/19/18 1221 8     Pain Loc --      Pain Edu? --      Excl. in GC? --    No data found.  Updated  Vital Signs BP 134/63   Pulse 60   Temp 97.6 F (36.4 C) (Oral)   Resp 16   SpO2 100%   Visual Acuity Right Eye Distance:   Left Eye Distance:   Bilateral Distance:    Right Eye Near:   Left Eye Near:    Bilateral Near:     Physical Exam Vitals signs and nursing note reviewed.  Constitutional:      Appearance: Normal appearance.  HENT:     Head: Normocephalic and atraumatic.     Nose: Nose normal.  Eyes:     Conjunctiva/sclera: Conjunctivae normal.  Neck:     Musculoskeletal: Normal range of motion.  Pulmonary:     Effort: Pulmonary effort is normal.  Musculoskeletal: Normal range of motion.        General: Swelling and tenderness present.     Comments: Tenderness and swelling to the left lateral malleolus. No deformity. No bruising or erythema.   Sensation and pulse intact.  Neurological:     Mental Status: He is alert.      UC Treatments / Results  Labs (all labs ordered are listed, but only abnormal results are displayed) Labs Reviewed - No data to display  EKG None  Radiology Dg Ankle Complete Left  Result Date: 01/19/2018 CLINICAL DATA:  Tripped on stairs 2 weeks ago injury ankle. History of ankle fracture. EXAM: LEFT ANKLE COMPLETE - 3+ VIEW COMPARISON:  04/21/2015 FINDINGS: No fracture or dislocation. Joint spaces appear preserved. The ankle mortise appears preserved. Small to moderate sized ankle joint effusion. Regional soft tissues appear normal. No radiopaque foreign body. IMPRESSION: Small to moderate sized ankle joint effusion without associated fracture or dislocation. Electronically Signed   By: Simonne Come M.D.   On: 01/19/2018 13:17    Procedures Procedures (including critical care time)  Medications Ordered in UC Medications - No data to display  Initial Impression / Assessment and Plan / UC Course  I have reviewed the triage vital signs and the nursing notes.  Pertinent labs & imaging results that were available during my care of  the patient were reviewed by me and considered in my medical decision making (see chart for details).     X ray reveled no fracture.  Most likely bad sprain Will pace in ASO Rest, ice, elevate,  Ibuprofen for pain and inflammation.  Final Clinical Impressions(s) / UC Diagnoses   Final diagnoses:  Sprain of left ankle, unspecified ligament, initial encounter     Discharge Instructions     Brace on your ankle. Patient you are resting, icing and elevating the ankle You can take ibuprofen as needed for pain inflammation Follow up as needed for continued or worsening symptoms     ED Prescriptions    None     Controlled Substance Prescriptions [place] Controlled Substance Registry consulted? Not Applicable   Janace Aris, NP 01/20/18 1340

## 2018-01-30 ENCOUNTER — Other Ambulatory Visit: Payer: Self-pay

## 2018-01-30 ENCOUNTER — Emergency Department (HOSPITAL_COMMUNITY): Payer: Medicaid Other

## 2018-01-30 ENCOUNTER — Emergency Department (HOSPITAL_COMMUNITY)
Admission: EM | Admit: 2018-01-30 | Discharge: 2018-01-30 | Disposition: A | Discharge status: Home / Self Care | Payer: Medicaid Other | Attending: Emergency Medicine | Admitting: Emergency Medicine

## 2018-01-30 DIAGNOSIS — J101 Influenza due to other identified influenza virus with other respiratory manifestations: Secondary | ICD-10-CM | POA: Diagnosis not present

## 2018-01-30 DIAGNOSIS — F17290 Nicotine dependence, other tobacco product, uncomplicated: Secondary | ICD-10-CM | POA: Diagnosis not present

## 2018-01-30 DIAGNOSIS — R509 Fever, unspecified: Secondary | ICD-10-CM | POA: Diagnosis present

## 2018-01-30 LAB — INFLUENZA PANEL BY PCR (TYPE A & B)
INFLAPCR: NEGATIVE
INFLBPCR: POSITIVE — AB

## 2018-01-30 MED ORDER — BENZONATATE 100 MG PO CAPS: 100.0000 mg | ORAL_CAPSULE | Freq: Three times a day (TID) | ORAL | 0 refills | Status: DC

## 2018-01-30 MED ORDER — ACETAMINOPHEN 325 MG PO TABS
650.0000 mg | ORAL_TABLET | Freq: Once | ORAL | Status: AC | PRN
  Administered 2018-01-30: 650 mg via ORAL
  Filled 2018-01-30: qty 2

## 2018-01-30 MED ORDER — ALBUTEROL SULFATE HFA 108 (90 BASE) MCG/ACT IN AERS
2.0000 | INHALATION_SPRAY | RESPIRATORY_TRACT | Status: DC | PRN
  Filled 2018-01-30: qty 6.7

## 2018-01-30 NOTE — Discharge Instructions (Signed)
Your flu swab is positive for influenza B. Be sure you are drinking plenty of fluids so you do not get dehydrated. Take tylenol and ibuprofen as needed for fever and body aches. Follow up with your doctor or return here for worsening symptoms.

## 2018-01-30 NOTE — ED Provider Notes (Signed)
[facility]-EMERGENCY DEPT Provider Note   CSN: [id number] Arrival date & time: 01/30/18  1423     History   Chief Complaint Chief Complaint  Patient presents with  . Fever  . Chills    HPI Todd RodDreasean C Saia is a 22 y.o. male who presents to the ED with fever, chills and body aches x 4 days.   The history is provided by the patient. No language interpreter was used.  Influenza  Presenting symptoms: cough, fatigue, fever, headache and myalgias   Presenting symptoms: no vomiting  Sore throat: scratchy.   Severity:  Moderate Duration:  4 days Progression:  Waxing and waning Chronicity:  New Relieved by:  Nothing Worsened by:  Nothing Ineffective treatments:  OTC medications Associated symptoms: chills, decreased appetite and nasal congestion   Associated symptoms: no neck stiffness     Past Medical History:  Diagnosis Date  . Dizziness 05/22/2017  . Heart murmur   . Palpitations 05/22/2017    Patient Active Problem List   Diagnosis Date Noted  . Palpitations 05/22/2017  . Dizziness 05/22/2017  . Heart murmur     No past surgical history on file.      Home Medications    Prior to Admission medications   Not on File    Family History Family History  Problem Relation Age of Onset  . Healthy Mother   . Healthy Father     Social History Social History   Tobacco Use  . Smoking status: Current Some Day Smoker    Types: Cigars  . Smokeless tobacco: Never Used  Substance Use Topics  . Alcohol use: Yes    Comment: occasionally  . Drug use: Not Currently    Types: Marijuana     Allergies   Flexeril [cyclobenzaprine] and Ibuprofen   Review of Systems Review of Systems  Constitutional: Positive for chills, decreased appetite, fatigue and fever.  HENT: Positive for congestion. Sore throat: scratchy.   Eyes: Negative for visual disturbance.  Respiratory: Positive for cough.   Cardiovascular: Negative for chest pain.    Gastrointestinal: Negative for abdominal pain and vomiting.  Genitourinary: Negative for decreased urine volume.  Musculoskeletal: Positive for myalgias. Negative for neck stiffness.  Skin: Negative for rash.  Neurological: Positive for headaches.  Psychiatric/Behavioral: Negative for confusion.     Physical Exam Updated Vital Signs BP 116/69 (BP Location: Left Arm)   Pulse 89   Temp 99.8 F (37.7 C) (Oral)   Resp 18   Ht 5\' 10"  (1.778 m)   Wt 70.3 kg   SpO2 99%   BMI 22.24 kg/m   Physical Exam Vitals signs and nursing note reviewed.  Constitutional:      General: He is not in acute distress.    Appearance: He is well-developed.  HENT:     Head: Normocephalic.     Right Ear: Tympanic membrane normal.     Left Ear: Tympanic membrane normal.     Nose: Congestion present.     Mouth/Throat:     Pharynx: Oropharynx is clear.  Eyes:     Extraocular Movements: Extraocular movements intact.     Conjunctiva/sclera: Conjunctivae normal.  Neck:     Musculoskeletal: Normal range of motion and neck supple.  Cardiovascular:     Rate and Rhythm: Normal rate and regular rhythm.  Pulmonary:     Effort: Pulmonary effort is normal.     Breath sounds: No wheezing or rales.  Abdominal:     Palpations: Abdomen  is soft.     Tenderness: There is no abdominal tenderness.  Musculoskeletal: Normal range of motion.  Lymphadenopathy:     Cervical: No cervical adenopathy.  Skin:    General: Skin is warm and dry.  Neurological:     Mental Status: He is alert and oriented to person, place, and time.  Psychiatric:        Mood and Affect: Mood normal.      ED Treatments / Results  Labs (all labs ordered are listed, but only abnormal results are displayed) Labs Reviewed  INFLUENZA PANEL BY PCR (TYPE A & B) - Abnormal; Notable for the following components:      Result Value   Influenza B By PCR POSITIVE (*)    All other components within normal limits    Radiology Dg Chest 2  View  Result Date: 01/30/2018 CLINICAL DATA:  Cough and fever EXAM: CHEST - 2 VIEW COMPARISON:  May 17 2017 FINDINGS: No edema or consolidation. Heart size and pulmonary vascularity are normal. No adenopathy. No bone lesions. IMPRESSION: No edema or consolidation. Electronically Signed   By: Bretta BangWilliam  Woodruff III M.D.   On: 01/30/2018 17:23    Procedures Procedures (including critical care time)  Medications Ordered in ED Medications  acetaminophen (TYLENOL) tablet 650 mg (650 mg Oral Given 01/30/18 1445)     Initial Impression / Assessment and Plan / ED Course  I have reviewed the triage vital signs and the nursing notes. SUBJECTIVE:  Todd Mcguire is a 22 y.o. male who present complaining of flu-like symptoms: fevers, chills, myalgias, congestion, sore throat and cough for 4 days. Denies dyspnea or wheezing.  OBJECTIVE: Appears moderately ill but not toxic; temperature as noted in vitals. Ears normal. Throat and pharynx normal.  Neck supple. No adenopathy in the neck. Sinuses non tender. The chest is clear.  ASSESSMENT: Influenza B   PLAN: Symptomatic therapy suggested: rest, increase fluids, gargle prn for sore throat, use mist of vaporizer prn and f/u with PCP or return here prn if symptoms persist or worsen. Will prescribe cough medication and patient to take tylenol and ibuprofen as needed for fever and body aches.   Final Clinical Impressions(s) / ED Diagnoses   Final diagnoses:  Influenza B    ED Discharge Orders    None       Kerrie Buffaloeese, [place], NP 01/30/18 1818    Jacalyn LefevreHaviland, Julie, MD 01/30/18 2243

## 2018-01-30 NOTE — ED Triage Notes (Signed)
Pt reports having fever, chills, body aches x4 days.  Denies vomiting or diarrhea.

## 2018-07-06 ENCOUNTER — Other Ambulatory Visit: Payer: Self-pay

## 2018-07-06 ENCOUNTER — Emergency Department (HOSPITAL_COMMUNITY)
Admission: EM | Admit: 2018-07-06 | Discharge: 2018-07-06 | Disposition: A | Discharge status: Home / Self Care | Payer: Medicaid Other | Attending: Emergency Medicine | Admitting: Emergency Medicine

## 2018-07-06 ENCOUNTER — Encounter (HOSPITAL_COMMUNITY): Payer: Self-pay | Admitting: Emergency Medicine

## 2018-07-06 DIAGNOSIS — Y9389 Activity, other specified: Secondary | ICD-10-CM | POA: Insufficient documentation

## 2018-07-06 DIAGNOSIS — F17290 Nicotine dependence, other tobacco product, uncomplicated: Secondary | ICD-10-CM | POA: Insufficient documentation

## 2018-07-06 DIAGNOSIS — G514 Facial myokymia: Secondary | ICD-10-CM

## 2018-07-06 DIAGNOSIS — S90212A Contusion of left great toe with damage to nail, initial encounter: Secondary | ICD-10-CM | POA: Insufficient documentation

## 2018-07-06 DIAGNOSIS — Y990 Civilian activity done for income or pay: Secondary | ICD-10-CM | POA: Insufficient documentation

## 2018-07-06 DIAGNOSIS — G5131 Clonic hemifacial spasm, right: Secondary | ICD-10-CM | POA: Insufficient documentation

## 2018-07-06 DIAGNOSIS — Y9259 Other trade areas as the place of occurrence of the external cause: Secondary | ICD-10-CM | POA: Insufficient documentation

## 2018-07-06 DIAGNOSIS — W228XXA Striking against or struck by other objects, initial encounter: Secondary | ICD-10-CM | POA: Insufficient documentation

## 2018-07-06 NOTE — ED Notes (Signed)
Patient verbalizes understanding of discharge instructions . Opportunity for questions and answers were provided . Armband removed by staff ,Pt discharged from ED. W/C  offered at D/C  and Declined W/C at D/C and was escorted to lobby by RN.  

## 2018-07-06 NOTE — ED Provider Notes (Signed)
[facility] EMERGENCY DEPARTMENT Provider Note   CSN: [id number] Arrival date & time: 07/06/18  1112    History   Chief Complaint Chief Complaint  Patient presents with  . Eye Problem  . Nail Problem    HPI Todd Mcguire is a 23 y.o. male who presents to the ED today complaining of right eye twitching x 1 week. No vision changes, eye pain, drainage, fever, chills. He also reports a "spot" on his left great toe nail; he denies pain to this area. States he noticed it yesterday and wanted to get it checked out. Pt able to ambulate without issue. 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 No other complaints at this time.        Past Medical History:  Diagnosis Date  . Dizziness 05/22/2017  . Heart murmur   . Palpitations 05/22/2017    Patient Active Problem List   Diagnosis Date Noted  . Palpitations 05/22/2017  . Dizziness 05/22/2017  . Heart murmur     History reviewed. No pertinent surgical history.      Home Medications    Prior to Admission medications   Medication Sig Start Date End Date Taking? Authorizing Provider  benzonatate (TESSALON) 100 MG capsule Take 1 capsule (100 mg total) by mouth every 8 (eight) hours. 01/30/18   Janne Napoleon, NP    Family History Family History  Problem Relation Age of Onset  . Healthy Mother   . Healthy Father     Social History Social History   Tobacco Use  . Smoking status: Current Some Day Smoker    Types: Cigars  . Smokeless tobacco: Never Used  Substance Use Topics  . Alcohol use: Yes    Comment: occasionally  . Drug use: Not Currently    Types: Marijuana     Allergies   Flexeril [cyclobenzaprine] and Ibuprofen   Review of Systems Review of Systems  Constitutional: Negative for chills and fever.  Eyes: Negative for pain, redness and visual disturbance.       + Twitching of eyelid  Respiratory: Negative for cough and shortness of  breath.   Musculoskeletal: Negative for arthralgias and joint swelling.  Skin: Positive for color change (dark spot on nail of left great toe).     Physical Exam Updated Vital Signs BP (!) 152/73 (BP Location: Right Arm)   Pulse 70   Temp 98.1 F (36.7 C) (Oral)   Resp 12   SpO2 100%   Physical Exam Vitals signs and nursing note reviewed.  Constitutional:      Appearance: He is not ill-appearing.  HENT:     Head: Normocephalic and atraumatic.  Eyes:     General:        Right eye: No discharge.        Left eye: No discharge.     Extraocular Movements: Extraocular movements intact.     Conjunctiva/sclera: Conjunctivae normal.     Pupils: Pupils are equal, round, and reactive to light.  Cardiovascular:     Rate and Rhythm: Normal rate and regular rhythm.     Heart sounds: Murmur present.  Pulmonary:     Effort: Pulmonary effort is normal.     Breath sounds: Normal breath sounds. No wheezing, rhonchi or rales.  Abdominal:     General: Abdomen is flat.     Tenderness: There is no abdominal tenderness. There is no guarding or rebound.  Musculoskeletal:  Comments: Small 2 mm subungual hematoma noted to left great toe; no tenderness to palpation to nail or joints of toe; cap refill < 2 seconds; 2+ DP pulse  Skin:    General: Skin is warm and dry.     Coloration: Skin is not jaundiced.  Neurological:     Mental Status: He is alert.     Comments: CN 3-12 grossly intact A&O x4 GCS 15 Sensation and strength intact Gait nonataxic including with tandem walking Coordination with finger-to-nose WNL Neg romberg, neg pronator drift       ED Treatments / Results  Labs (all labs ordered are listed, but only abnormal results are displayed) Labs Reviewed - No data to display  EKG None  Radiology No results found.  Procedures Procedures (including critical care time)  Medications Ordered in ED Medications - No data to display   Initial Impression / Assessment and  Plan / ED Course  I have reviewed the triage vital signs and the nursing notes.  Pertinent labs & imaging results that were available during my care of the patient were reviewed by me and considered in my medical decision making (see chart for details).    Pt is a otherwise healthy male who presents with complaint of twitching of right eye and spot on left great toe; reports he has been drinking a lot of teas lately. No vision changes, eye pain, fever, chills, drainage. Suspect patient may be mildly dehydrated or stressed out; he reports his girlfriend just recently found out she is pregnant and is due in Jan 2021. Regarding toe; this appears to be a very small subungual hematoma; no tenderness to nailbed or toe itself; do not feel patient needs imaging of toe at this time; he was able to ambulate into the ED without issue; [facility] as he does not want nail trephination at this time and I do not feel it is necessary as patient reports 0 pain to the toe. Pt is in agreement with plan at this time and stable for discharge home.       Final Clinical Impressions(s) / ED Diagnoses   Final diagnoses:  Facial twitching  Subungual hematoma of great toe of left foot, initial encounter    ED Discharge Orders    None       Tanda RockersVenter, Ryen Heitmeyer, PA-C 07/06/18 1532    Gwyneth SproutPlunkett, [place], MD 07/07/18 2155

## 2018-07-06 NOTE — ED Triage Notes (Signed)
States rt eye twitching x 1 week and has a spot on nhis  Left big toe that he wants looks at , doesn't really hurt he states

## 2018-07-06 NOTE — Discharge Instructions (Signed)
You were seen in the ED today for twitching of your eye and a spot on your nail; please see attached resource guide about your toe. You can take Ibuprofen and Tylenol as needed if you develop pain to your toe. You can also ice and elevate your toe for comfort. Please increase fluid intake to help with your facial twitching; you can follow up with [facility] for your primary care needs.

## 2018-12-26 ENCOUNTER — Ambulatory Visit (INDEPENDENT_AMBULATORY_CARE_PROVIDER_SITE_OTHER): Payer: Self-pay

## 2018-12-26 ENCOUNTER — Other Ambulatory Visit: Payer: Self-pay

## 2018-12-26 ENCOUNTER — Encounter (HOSPITAL_COMMUNITY): Payer: Self-pay

## 2018-12-26 ENCOUNTER — Ambulatory Visit (HOSPITAL_COMMUNITY)
Admission: EM | Admit: 2018-12-26 | Discharge: 2018-12-26 | Disposition: A | Discharge status: Home / Self Care | Payer: Self-pay | Attending: Family Medicine | Admitting: Family Medicine

## 2018-12-26 DIAGNOSIS — S93602A Unspecified sprain of left foot, initial encounter: Secondary | ICD-10-CM

## 2018-12-26 NOTE — ED Triage Notes (Signed)
Pt presents with left ankle pain after a fall injury yesterday.

## 2018-12-26 NOTE — Discharge Instructions (Addendum)
Most likely bad foot/ankle sprain X ray did not show any fractures.  Ankle brace given here Stay off the foot, rest, ice the foot Ibuprofen as needed.  Light duty If the symptoms do not improve or worsen over the next week please let us know.

## 2018-12-26 NOTE — ED Provider Notes (Signed)
[facility]    CSN: [id number] Arrival date & time: 12/26/18  1005      History   Chief Complaint Chief Complaint  Patient presents with  . Ankle Pain    left    HPI Todd Mcguire is a 23 y.o. male.   Patient is a 23 year old male who presents today with left foot/ankle pain.  This started after a fall yesterday.  Does not remember the fall or actually happened.  He has pain and swelling to left ankle and foot.  This is been constant.  He believes it is somewhat improved today compared to yesterday.  He did rest the foot.  Did not take anything for pain.  No numbness, tingling, weakness or deformities.  He is able to ambulate with no issue.  ROS per HPI      Past Medical History:  Diagnosis Date  . Dizziness 05/22/2017  . Heart murmur   . Palpitations 05/22/2017    Patient Active Problem List   Diagnosis Date Noted  . Palpitations 05/22/2017  . Dizziness 05/22/2017  . Heart murmur     History reviewed. No pertinent surgical history.     Home Medications    Prior to Admission medications   Not on File    Family History Family History  Problem Relation Age of Onset  . Healthy Mother   . Healthy Father     Social History Social History   Tobacco Use  . Smoking status: Current Some Day Smoker    Types: Cigars  . Smokeless tobacco: Never Used  Substance Use Topics  . Alcohol use: Yes    Comment: occasionally  . Drug use: Not Currently    Types: Marijuana     Allergies   Flexeril [cyclobenzaprine] and Ibuprofen   Review of Systems Review of Systems   Physical Exam Triage Vital Signs ED Triage Vitals  Enc Vitals Group     BP 12/26/18 1053 128/67     Pulse Rate 12/26/18 1053 62     Resp 12/26/18 1053 18     Temp 12/26/18 1053 98.4 F (36.9 C)     Temp Source 12/26/18 1053 Oral     SpO2 12/26/18 1053 100 %     Weight --      Height --      Head Circumference --      Peak Flow --      Pain Score 12/26/18 1054 7   Pain Loc --      Pain Edu? --      Excl. in GC? --    No data found.  Updated Vital Signs BP 128/67 (BP Location: Right Arm)   Pulse 62   Temp 98.4 F (36.9 C) (Oral)   Resp 18   SpO2 100%   Visual Acuity Right Eye Distance:   Left Eye Distance:   Bilateral Distance:    Right Eye Near:   Left Eye Near:    Bilateral Near:     Physical Exam Vitals signs and nursing note reviewed.  Constitutional:      Appearance: Normal appearance.  HENT:     Head: Normocephalic and atraumatic.     Nose: Nose normal.  Eyes:     Conjunctiva/sclera: Conjunctivae normal.  Neck:     Musculoskeletal: Normal range of motion.  Pulmonary:     Effort: Pulmonary effort is normal.  Musculoskeletal: Normal range of motion.        General: Swelling present.  Left foot: Normal range of motion and normal capillary refill. Tenderness and swelling present. No bony tenderness, crepitus, deformity or laceration.       Feet:  Skin:    General: Skin is warm and dry.  Neurological:     Mental Status: He is alert.  Psychiatric:        Mood and Affect: Mood normal.      UC Treatments / Results  Labs (all labs ordered are listed, but only abnormal results are displayed) Labs Reviewed - No data to display  EKG   Radiology Dg Ankle Complete Left  Result Date: 12/26/2018 CLINICAL DATA:  Left ankle pain after fall yesterday. EXAM: LEFT ANKLE COMPLETE - 3+ VIEW COMPARISON:  January 19, 2018. FINDINGS: There is no evidence of fracture, dislocation, or joint effusion. There is no evidence of arthropathy or other focal bone abnormality. Soft tissues are unremarkable. IMPRESSION: Negative. Electronically Signed   By: Marijo Conception M.D.   On: 12/26/2018 11:19    Procedures Procedures (including critical care time)  Medications Ordered in UC Medications - No data to display  Initial Impression / Assessment and Plan / UC Course  I have reviewed the triage vital signs and the nursing notes.   Pertinent labs & imaging results that were available during my care of the patient were reviewed by me and considered in my medical decision making (see chart for details).     Ankle pain/foot pain after fall.-X-ray negative for any fracture Most likely bad sprain.  We will have him rest, ice, elevate and give him light duty for work for 1 week. He can take ibuprofen for pain and inflammation. Ankle brace given here in clinic Follow up as needed for continued or worsening symptoms  Final Clinical Impressions(s) / UC Diagnoses   Final diagnoses:  Sprain of left foot, initial encounter     Discharge Instructions     Most likely bad foot/ankle sprain X ray did not show any fractures.  Ankle brace given here Stay off the foot, rest, ice the foot Ibuprofen as needed.  Light duty If the symptoms do not improve or worsen over the next week please let us know.     ED Prescriptions    None     PDMP not reviewed this encounter.   Orvan July, NP 12/26/18 1154

## 2019-02-22 ENCOUNTER — Ambulatory Visit (HOSPITAL_COMMUNITY)
Admission: EM | Admit: 2019-02-22 | Discharge: 2019-02-22 | Disposition: A | Discharge status: Home / Self Care | Payer: Medicaid Other | Attending: Urgent Care | Admitting: Urgent Care

## 2019-02-22 ENCOUNTER — Encounter (HOSPITAL_COMMUNITY): Payer: Self-pay

## 2019-02-22 ENCOUNTER — Other Ambulatory Visit: Payer: Self-pay

## 2019-02-22 DIAGNOSIS — Z0000 Encounter for general adult medical examination without abnormal findings: Secondary | ICD-10-CM

## 2019-02-22 DIAGNOSIS — Z113 Encounter for screening for infections with a predominantly sexual mode of transmission: Secondary | ICD-10-CM

## 2019-02-22 DIAGNOSIS — Z7251 High risk heterosexual behavior: Secondary | ICD-10-CM | POA: Diagnosis not present

## 2019-02-22 NOTE — ED Provider Notes (Signed)
  [facility]   MRN: [id number] DOB: 1995/02/07  Subjective:   Todd Mcguire is a 24 y.o. male presenting for STI testing. Denies dysuria, hematuria, urinary frequency, penile discharge, penile swelling, testicular pain, testicular swelling, anal pain, groin pain.   No current facility-administered medications for this encounter. No current outpatient medications on file.   Allergies  Allergen Reactions  . Flexeril [Cyclobenzaprine] Other (See Comments)    Did not like the way it made him feel  . Ibuprofen Other (See Comments)    Stomach pain at 600 mg dose; can take smaller doses    Past Medical History:  Diagnosis Date  . Dizziness 05/22/2017  . Heart murmur   . Palpitations 05/22/2017     History reviewed. No pertinent surgical history.  Family History  Problem Relation Age of Onset  . Healthy Mother   . Healthy Father     Social History   Tobacco Use  . Smoking status: Current Some Day Smoker    Types: Cigars  . Smokeless tobacco: Never Used  Substance Use Topics  . Alcohol use: Yes    Comment: occasionally  . Drug use: Not Currently    Types: Marijuana    ROS   Objective:   Vitals: BP 126/74 (BP Location: Right Arm)   Pulse 64   Temp 98.4 F (36.9 C) (Oral)   Resp 16   Wt 158 lb (71.7 kg)   SpO2 100%   BMI 22.67 kg/m   Physical Exam Constitutional:      General: He is not in acute distress.    Appearance: Normal appearance. He is well-developed and normal weight. He is not ill-appearing, toxic-appearing or diaphoretic.  HENT:     Head: Normocephalic and atraumatic.     Right Ear: External ear normal.     Left Ear: External ear normal.     Nose: Nose normal.     Mouth/Throat:     Pharynx: Oropharynx is clear.  Eyes:     General: No scleral icterus.       Right eye: No discharge.        Left eye: No discharge.     Extraocular Movements: Extraocular movements intact.     Pupils: Pupils are equal, round, and reactive to light.    Cardiovascular:     Rate and Rhythm: Normal rate.  Pulmonary:     Effort: Pulmonary effort is normal.  Genitourinary:    Penis: Circumcised. No phimosis, paraphimosis, hypospadias, erythema, tenderness, discharge, swelling or lesions.   Musculoskeletal:     Cervical back: Normal range of motion.  Neurological:     Mental Status: He is alert and oriented to person, place, and time.  Psychiatric:        Mood and Affect: Mood normal.        Behavior: Behavior normal.        Thought Content: Thought content normal.        Judgment: Judgment normal.      Assessment and Plan :   1. Unprotected sex     STI testing pending. Will treat as appropriate.   Wallis Bamberg, PA-C 02/22/19 1039

## 2019-02-22 NOTE — ED Triage Notes (Signed)
Pt states he would like to get tested for STDs. No symptoms

## 2019-02-23 LAB — CYTOLOGY, (ORAL, ANAL, URETHRAL) ANCILLARY ONLY
Chlamydia: NEGATIVE
Neisseria Gonorrhea: NEGATIVE
Trichomonas: NEGATIVE

## 2019-02-25 ENCOUNTER — Other Ambulatory Visit: Payer: Self-pay

## 2019-02-25 ENCOUNTER — Ambulatory Visit (HOSPITAL_COMMUNITY)
Admission: EM | Admit: 2019-02-25 | Discharge: 2019-02-25 | Disposition: A | Discharge status: Home / Self Care | Payer: Medicaid Other

## 2019-02-25 NOTE — ED Notes (Signed)
Registration reports patient did not want charge and left.  John, patient access reports patient wanted a covid test, but did not have the right medicaid.  John reports patient said patient  is making an appt tomorrow at testing site

## 2019-10-11 ENCOUNTER — Other Ambulatory Visit: Payer: Self-pay

## 2019-10-11 ENCOUNTER — Emergency Department (HOSPITAL_COMMUNITY)
Admission: EM | Admit: 2019-10-11 | Discharge: 2019-10-11 | Discharge status: Against Medical Advice | Payer: Medicaid Other

## 2020-07-17 ENCOUNTER — Other Ambulatory Visit: Payer: Self-pay

## 2020-07-17 ENCOUNTER — Encounter (HOSPITAL_BASED_OUTPATIENT_CLINIC_OR_DEPARTMENT_OTHER): Payer: Self-pay

## 2020-07-17 ENCOUNTER — Emergency Department (HOSPITAL_BASED_OUTPATIENT_CLINIC_OR_DEPARTMENT_OTHER)
Admission: EM | Admit: 2020-07-17 | Discharge: 2020-07-18 | Disposition: A | Discharge status: Home / Self Care | Payer: Medicaid Other | Attending: Emergency Medicine | Admitting: Emergency Medicine

## 2020-07-17 DIAGNOSIS — R369 Urethral discharge, unspecified: Secondary | ICD-10-CM | POA: Insufficient documentation

## 2020-07-17 DIAGNOSIS — Z202 Contact with and (suspected) exposure to infections with a predominantly sexual mode of transmission: Secondary | ICD-10-CM | POA: Insufficient documentation

## 2020-07-17 DIAGNOSIS — F17290 Nicotine dependence, other tobacco product, uncomplicated: Secondary | ICD-10-CM | POA: Insufficient documentation

## 2020-07-17 DIAGNOSIS — Z7251 High risk heterosexual behavior: Secondary | ICD-10-CM

## 2020-07-17 MED ORDER — CEFTRIAXONE SODIUM 500 MG IJ SOLR
500.0000 mg | Freq: Once | INTRAMUSCULAR | Status: AC
  Administered 2020-07-17: 500 mg via INTRAMUSCULAR
  Filled 2020-07-17: qty 500

## 2020-07-17 MED ORDER — DOXYCYCLINE HYCLATE 100 MG PO TABS
100.0000 mg | ORAL_TABLET | Freq: Once | ORAL | Status: AC
  Administered 2020-07-17: 100 mg via ORAL
  Filled 2020-07-17: qty 1

## 2020-07-17 NOTE — ED Triage Notes (Addendum)
Pt states, "I think I have a STD". Pt states he was possibly exposed on Monday 07/11/20 and c/o minimal white discharge. Denies any penile pain, redness, or swelling. Afebrile.

## 2020-07-18 LAB — URINALYSIS, ROUTINE W REFLEX MICROSCOPIC
Bilirubin Urine: NEGATIVE
Glucose, UA: NEGATIVE mg/dL
Hgb urine dipstick: NEGATIVE
Ketones, ur: NEGATIVE mg/dL
Nitrite: NEGATIVE
Specific Gravity, Urine: 1.037 — ABNORMAL HIGH (ref 1.005–1.030)
pH: 6.5 (ref 5.0–8.0)

## 2020-07-18 MED ORDER — DOXYCYCLINE HYCLATE 100 MG PO CAPS: 100.0000 mg | ORAL_CAPSULE | Freq: Two times a day (BID) | ORAL | 0 refills | Status: DC

## 2020-07-18 NOTE — Discharge Instructions (Addendum)
You should always use protection when engaging in any sort of sexual activity.  Strongly recommend use of condoms.  You should not have any sexual engagement with your sexual partners until at least 1 week after both you and they have received appropriate treatment for the suspected sexually transmitted disease.  Please follow-up in MyChart to see the results of your gonorrhea and Chlamydia testing.  Please complete the course of doxycycline.

## 2020-07-18 NOTE — ED Provider Notes (Signed)
[facility] EMERGENCY DEPT Provider Note   CSN: [id number] Arrival date & time: 07/17/20  2324     History Chief Complaint  Patient presents with   Exposure to STD    Todd Mcguire is a 25 y.o. male.  Presents to ER with concern for STDs.  States that he thinks he was exposed to a sexually transmitted disease during sexual intercourse a couple days ago.  Sexually active with male partner.  Does not regularly use protection.  Unsure of what disease he was exposed to but thinks it was chlamydia.  States that today he had noted a small yellow discharge at 1 point.  No dysuria or hematuria.  No fevers or chills.  Denies any lesions or swelling to his penis or scrotum.  HPI     Past Medical History:  Diagnosis Date   Dizziness 05/22/2017   Heart murmur    Palpitations 05/22/2017    Patient Active Problem List   Diagnosis Date Noted   Palpitations 05/22/2017   Dizziness 05/22/2017   Heart murmur     History reviewed. No pertinent surgical history.     Family History  Problem Relation Age of Onset   Healthy Mother    Healthy Father     Social History   Tobacco Use   Smoking status: Some Days    Pack years: 0.00    Types: Cigars   Smokeless tobacco: Never  Substance Use Topics   Alcohol use: Yes    Comment: occasionally   Drug use: Not Currently    Types: Marijuana    Home Medications Prior to Admission medications   Medication Sig Start Date End Date Taking? Authorizing Provider  doxycycline (VIBRAMYCIN) 100 MG capsule Take 1 capsule (100 mg total) by mouth 2 (two) times daily for 7 days. 07/18/20 07/25/20 Yes Roselinda Bahena, Quitman Livings, MD    Allergies    Flexeril [cyclobenzaprine] and Ibuprofen  Review of Systems   Review of Systems  Genitourinary:  Positive for penile discharge.  All other systems reviewed and are negative.  Physical Exam Updated Vital Signs BP (!) 139/99 (BP Location: Right Arm)   Pulse 65   Temp 98 F (36.7 C) (Oral)    Resp 18   Ht 5\' 9"  (1.753 m)   Wt 77.1 kg   SpO2 97%   BMI 25.10 kg/m   Physical Exam Vitals and nursing note reviewed.  Constitutional:      Appearance: He is well-developed.  HENT:     Head: Normocephalic and atraumatic.  Eyes:     Conjunctiva/sclera: Conjunctivae normal.  Cardiovascular:     Rate and Rhythm: Normal rate and regular rhythm.     Heart sounds: No murmur heard. Pulmonary:     Effort: Pulmonary effort is normal. No respiratory distress.  Musculoskeletal:        General: No deformity or signs of injury.     Cervical back: Neck supple.  Skin:    General: Skin is warm and dry.  Neurological:     General: No focal deficit present.     Mental Status: He is alert.  Psychiatric:        Mood and Affect: Mood normal.    ED Results / Procedures / Treatments   Labs (all labs ordered are listed, but only abnormal results are displayed) Labs Reviewed  URINALYSIS, ROUTINE W REFLEX MICROSCOPIC - Abnormal; Notable for the following components:      Result Value   Specific Gravity, Urine 1.037 (*)  Protein, ur TRACE (*)    Leukocytes,Ua SMALL (*)    All other components within normal limits  GC/CHLAMYDIA PROBE AMP ([place]) NOT AT [facility]    EKG None  Radiology No results found.  Procedures Procedures   Medications Ordered in ED Medications  cefTRIAXone (ROCEPHIN) injection 500 mg (500 mg Intramuscular Given 07/17/20 2346)  doxycycline (VIBRA-TABS) tablet 100 mg (100 mg Oral Given 07/17/20 2346)    ED Course  I have reviewed the triage vital signs and the nursing notes.  Pertinent labs & imaging results that were available during my care of the patient were reviewed by me and considered in my medical decision making (see chart for details).    MDM Rules/Calculators/A&P                          25 year old male presents to ER with concern for penile discharge.  Denies any other associated complaints.  He does endorse having unprotected sex.  Patient  overall appears well and has stable vital signs.  Sent gonorrhea and Chlamydia testing.  Will treat empirically for gonorrhea and chlamydia with Rocephin and course of doxycycline.  Recommended obtaining testing for syphilis and HIV given his reported unprotected sex however patient declined.      After the discussed management above, the patient was determined to be safe for discharge.  The patient was in agreement with this plan and all questions regarding their care were answered.  ED return precautions were discussed and the patient will return to the ED with any significant worsening of condition.    Final Clinical Impression(s) / ED Diagnoses Final diagnoses:  Unprotected sex  Penile discharge    Rx / DC Orders ED Discharge Orders          Ordered    doxycycline (VIBRAMYCIN) 100 MG capsule  2 times daily        07/18/20 0004             Milagros Loll, MD 07/18/20 0011

## 2020-07-19 ENCOUNTER — Other Ambulatory Visit: Payer: Self-pay

## 2020-07-19 ENCOUNTER — Encounter (HOSPITAL_BASED_OUTPATIENT_CLINIC_OR_DEPARTMENT_OTHER): Payer: Self-pay | Admitting: Obstetrics and Gynecology

## 2020-07-19 ENCOUNTER — Emergency Department (HOSPITAL_BASED_OUTPATIENT_CLINIC_OR_DEPARTMENT_OTHER)
Admission: EM | Admit: 2020-07-19 | Discharge: 2020-07-19 | Disposition: A | Discharge status: Home / Self Care | Payer: Medicaid Other | Attending: Emergency Medicine | Admitting: Emergency Medicine

## 2020-07-19 DIAGNOSIS — L259 Unspecified contact dermatitis, unspecified cause: Secondary | ICD-10-CM | POA: Insufficient documentation

## 2020-07-19 DIAGNOSIS — N341 Nonspecific urethritis: Secondary | ICD-10-CM | POA: Insufficient documentation

## 2020-07-19 DIAGNOSIS — N342 Other urethritis: Secondary | ICD-10-CM

## 2020-07-19 DIAGNOSIS — F17210 Nicotine dependence, cigarettes, uncomplicated: Secondary | ICD-10-CM | POA: Insufficient documentation

## 2020-07-19 DIAGNOSIS — T364X5A Adverse effect of tetracyclines, initial encounter: Secondary | ICD-10-CM | POA: Insufficient documentation

## 2020-07-19 DIAGNOSIS — T887XXA Unspecified adverse effect of drug or medicament, initial encounter: Secondary | ICD-10-CM

## 2020-07-19 LAB — HIV ANTIBODY (ROUTINE TESTING W REFLEX): HIV Screen 4th Generation wRfx: NONREACTIVE

## 2020-07-19 MED ORDER — AZITHROMYCIN 250 MG PO TABS
1000.0000 mg | ORAL_TABLET | Freq: Once | ORAL | Status: AC
  Administered 2020-07-19: 1000 mg via ORAL

## 2020-07-19 NOTE — Discharge Instructions (Addendum)
You are treated here with Zithromax once which should cover any other sexually transmitted infections that the shot of Rocephin did not cover.  It appears that your STI testing may not have been added on.  Please follow-up with the health department.

## 2020-07-19 NOTE — ED Triage Notes (Signed)
Patient reports to the ER for fatigue and diarrhea after taking doxycyline.

## 2020-07-19 NOTE — ED Provider Notes (Signed)
[facility] EMERGENCY DEPT Provider Note   CSN: [id number] Arrival date & time: 07/19/20  1028     History Chief Complaint  Patient presents with   Medication Reaction    Todd Mcguire is a 25 y.o. male.  HPI 25 year old male seen here 2 days ago with urethritis symptoms.  Patient had urinalysis and GC chlamydia ordered.  GC chlamydia have not resulted.  Patient presents again today stating that he felt very tired and nauseated after taking the doxycycline.  He feels that he has had some type of reaction to the doxycycline.  He was treated with Rocephin in the emergency department.    Past Medical History:  Diagnosis Date   Dizziness 05/22/2017   Heart murmur    Palpitations 05/22/2017    Patient Active Problem List   Diagnosis Date Noted   Palpitations 05/22/2017   Dizziness 05/22/2017   Heart murmur     History reviewed. No pertinent surgical history.     Family History  Problem Relation Age of Onset   Healthy Mother    Healthy Father     Social History   Tobacco Use   Smoking status: Some Days    Pack years: 0.00    Types: Cigars   Smokeless tobacco: Never  Substance Use Topics   Alcohol use: Yes    Comment: occasionally   Drug use: Not Currently    Types: Marijuana    Home Medications Prior to Admission medications   Medication Sig Start Date End Date Taking? Authorizing Provider  doxycycline (VIBRAMYCIN) 100 MG capsule Take 1 capsule (100 mg total) by mouth 2 (two) times daily for 7 days. 07/18/20 07/25/20  Milagros Loll, MD    Allergies    Flexeril [cyclobenzaprine] and Ibuprofen  Review of Systems   Review of Systems  All other systems reviewed and are negative.  Physical Exam Updated Vital Signs BP 127/84 (BP Location: Right Arm)   Pulse 62   Temp 98 F (36.7 C)   Resp 18   SpO2 98%   Physical Exam Vitals and nursing note reviewed.  Constitutional:      Appearance: He is well-developed.  HENT:     Head:  Normocephalic and atraumatic.     Right Ear: External ear normal.     Left Ear: External ear normal.     Nose: Nose normal.  Eyes:     Extraocular Movements: Extraocular movements intact.  Neck:     Trachea: No tracheal deviation.  Pulmonary:     Effort: Pulmonary effort is normal.  Musculoskeletal:        General: Normal range of motion.  Skin:    General: Skin is warm and dry.  Neurological:     Mental Status: He is alert and oriented to person, place, and time.  Psychiatric:        Mood and Affect: Mood normal.        Behavior: Behavior normal.    ED Results / Procedures / Treatments   Labs (all labs ordered are listed, but only abnormal results are displayed) Labs Reviewed - No data to display  EKG None  Radiology No results found.  Procedures Procedures   Medications Ordered in ED Medications - No data to display  ED Course  I have reviewed the triage vital signs and the nursing notes.  Pertinent labs & imaging results that were available during my care of the patient were reviewed by me and considered in my medical decision making (  see chart for details).    MDM Rules/Calculators/A&P                          Discussed with lab.  Unclear if GC chlamydia added on occurred.  The patient is now been treated for 2 days, will just continue with empiric therapy.  Given patient's noncompliance with doxycycline-he took it once on Sunday and only once yesterday due to side effects, will treat with 1 g of Zithromax here in ED.  Patient counseled regarding need for follow-up. Final Clinical Impression(s) / ED Diagnoses Final diagnoses:  Urethritis  Medication side effect    Rx / DC Orders ED Discharge Orders     None        Margarita Grizzle, MD 07/19/20 1128

## 2020-07-20 LAB — GC/CHLAMYDIA PROBE AMP (~~LOC~~) NOT AT ARMC
Chlamydia: NEGATIVE
Comment: NEGATIVE
Comment: NORMAL
Neisseria Gonorrhea: NEGATIVE

## 2020-07-20 LAB — RPR: RPR Ser Ql: NONREACTIVE

## 2020-07-21 ENCOUNTER — Other Ambulatory Visit: Payer: Self-pay

## 2020-07-21 ENCOUNTER — Emergency Department (HOSPITAL_BASED_OUTPATIENT_CLINIC_OR_DEPARTMENT_OTHER): Payer: Medicaid Other | Admitting: Radiology

## 2020-07-21 ENCOUNTER — Emergency Department (HOSPITAL_BASED_OUTPATIENT_CLINIC_OR_DEPARTMENT_OTHER)
Admission: EM | Admit: 2020-07-21 | Discharge: 2020-07-21 | Disposition: A | Discharge status: Home / Self Care | Payer: Medicaid Other | Attending: Emergency Medicine | Admitting: Emergency Medicine

## 2020-07-21 ENCOUNTER — Encounter (HOSPITAL_BASED_OUTPATIENT_CLINIC_OR_DEPARTMENT_OTHER): Payer: Self-pay | Admitting: Obstetrics and Gynecology

## 2020-07-21 DIAGNOSIS — R197 Diarrhea, unspecified: Secondary | ICD-10-CM | POA: Insufficient documentation

## 2020-07-21 DIAGNOSIS — R0789 Other chest pain: Secondary | ICD-10-CM | POA: Insufficient documentation

## 2020-07-21 DIAGNOSIS — R5383 Other fatigue: Secondary | ICD-10-CM | POA: Insufficient documentation

## 2020-07-21 DIAGNOSIS — F17290 Nicotine dependence, other tobacco product, uncomplicated: Secondary | ICD-10-CM | POA: Insufficient documentation

## 2020-07-21 LAB — CBC
HCT: 48 % (ref 39.0–52.0)
Hemoglobin: 15.9 g/dL (ref 13.0–17.0)
MCH: 28 pg (ref 26.0–34.0)
MCHC: 33.1 g/dL (ref 30.0–36.0)
MCV: 84.7 fL (ref 80.0–100.0)
Platelets: 211 K/uL (ref 150–400)
RBC: 5.67 MIL/uL (ref 4.22–5.81)
RDW: 13.2 % (ref 11.5–15.5)
WBC: 7 K/uL (ref 4.0–10.5)
nRBC: 0 % (ref 0.0–0.2)

## 2020-07-21 LAB — TROPONIN I (HIGH SENSITIVITY)
Troponin I (High Sensitivity): 13 ng/L (ref <18)
Troponin I (High Sensitivity): 8 ng/L (ref <18)

## 2020-07-21 LAB — BASIC METABOLIC PANEL
Anion gap: 9 (ref 5–15)
BUN: 5 mg/dL — ABNORMAL LOW (ref 6–20)
CO2: 29 mmol/L (ref 22–32)
Calcium: 9.4 mg/dL (ref 8.9–10.3)
Chloride: 104 mmol/L (ref 98–111)
Creatinine, Ser: 0.91 mg/dL (ref 0.61–1.24)
GFR, Estimated: >60 mL/min (ref >60)
Glucose, Bld: 98 mg/dL (ref 70–99)
Potassium: 3.5 mmol/L (ref 3.5–5.1)
Sodium: 142 mmol/L (ref 135–145)

## 2020-07-21 NOTE — Discharge Instructions (Addendum)
I think that the changes in your bowel movements may be secondary to the 3 different antibiotics you have had in recent days.  May have upset your normal gastrointestinal flora.  Would recommend yogurt daily.  Work-up for the chest pain without any acute findings.  Referral information provided to follow-up with the wellness clinic as needed.  Return for any new or worse symptoms

## 2020-07-21 NOTE — ED Notes (Signed)
Left message to notify transitions of care team at 1640 of consult

## 2020-07-21 NOTE — ED Provider Notes (Signed)
[facility] EMERGENCY DEPT Provider Note   CSN: [id number] Arrival date & time: 07/21/20  1415     History Chief Complaint  Patient presents with   Medication Reaction   Chest Pain    Todd Mcguire is a 25 y.o. male.  Patient seen June 12 for STD exposure and was treated with Doxy and Rocephin.  Patient seen again on June 14 for a thrive to [place].  Of some fatigue having some loose bowel movements.  Was not compliant with the Doxy.  So was given 1 g of Zithromax myosin.  Patient states that the urethra discharge has cleared.  He has some fatigue he has had a little bit of loose bowel movements may be a little bit of diarrhea no blood.  Also today he developed chest pain anteriorly.  Still present but mild.  No shortness of breath no nausea or vomiting.  No fevers.  No rash.  Past medical history noncontributory      Past Medical History:  Diagnosis Date   Dizziness 05/22/2017   Heart murmur    Palpitations 05/22/2017    Patient Active Problem List   Diagnosis Date Noted   Palpitations 05/22/2017   Dizziness 05/22/2017   Heart murmur     No past surgical history on file.     Family History  Problem Relation Age of Onset   Healthy Mother    Healthy Father     Social History   Tobacco Use   Smoking status: Some Days    Pack years: 0.00    Types: Cigars   Smokeless tobacco: Never  Substance Use Topics   Alcohol use: Yes    Comment: occasionally   Drug use: Not Currently    Types: Marijuana    Home Medications Prior to Admission medications   Not on File    Allergies    Flexeril [cyclobenzaprine] and Ibuprofen  Review of Systems   Review of Systems  Constitutional:  Negative for chills and fever.  HENT:  Negative for congestion, ear pain and sore throat.   Eyes:  Negative for pain and visual disturbance.  Respiratory:  Negative for cough and shortness of breath.   Cardiovascular:  Positive for chest pain. Negative for palpitations and  leg swelling.  Gastrointestinal:  Positive for diarrhea. Negative for abdominal pain and vomiting.  Genitourinary:  Negative for dysuria and hematuria.  Musculoskeletal:  Negative for arthralgias and back pain.  Skin:  Negative for color change and rash.  Neurological:  Negative for seizures and syncope.  All other systems reviewed and are negative.  Physical Exam Updated Vital Signs BP (!) 157/91   Pulse (!) 50   Temp 99.3 F (37.4 C) (Tympanic)   Resp 17   Ht 1.753 m (5\' 9" )   Wt 77 kg   SpO2 100%   BMI 25.07 kg/m   Physical Exam Vitals and nursing note reviewed.  Constitutional:      Appearance: He is well-developed.  HENT:     Head: Normocephalic and atraumatic.  Eyes:     Extraocular Movements: Extraocular movements intact.     Conjunctiva/sclera: Conjunctivae normal.     Pupils: Pupils are equal, round, and reactive to light.  Cardiovascular:     Rate and Rhythm: Normal rate and regular rhythm.     Heart sounds: No murmur heard. Pulmonary:     Effort: Pulmonary effort is normal. No respiratory distress.     Breath sounds: Normal breath sounds. No wheezing or rales.  Chest:     Chest wall: No tenderness.  Abdominal:     Palpations: Abdomen is soft.     Tenderness: There is no abdominal tenderness.  Musculoskeletal:        General: No swelling.     Cervical back: Neck supple.  Skin:    General: Skin is warm and dry.  Neurological:     General: No focal deficit present.     Mental Status: He is alert and oriented to person, place, and time.     Cranial Nerves: No cranial nerve deficit.     Sensory: No sensory deficit.    ED Results / Procedures / Treatments   Labs (all labs ordered are listed, but only abnormal results are displayed) Labs Reviewed  BASIC METABOLIC PANEL - Abnormal; Notable for the following components:      Result Value   BUN 5 (*)    All other components within normal limits  CBC  TROPONIN I (HIGH SENSITIVITY)  TROPONIN I (HIGH  SENSITIVITY)  TROPONIN I (HIGH SENSITIVITY)    EKG EKG Interpretation  Date/Time:  Thursday July 21 2020 14:28:47 EDT Ventricular Rate:  65 PR Interval:  138 QRS Duration: 96 QT Interval:  370 QTC Calculation: 384 R Axis:   99 Text Interpretation: Normal sinus rhythm Rightward axis Borderline ECG Confirmed by Vanetta Mulders [phone]) on 07/21/2020 4:57:49 PM  Radiology DG Chest 2 View  Result Date: 07/21/2020 CLINICAL DATA:  Chest pain for 2 days EXAM: CHEST - 2 VIEW COMPARISON:  01/30/2018 FINDINGS: Cardiac shadow is within normal limits. No focal infiltrate or sizable effusion is seen. No bony abnormality is noted. IMPRESSION: No active cardiopulmonary disease. Electronically Signed   By: Alcide Clever M.D.   On: 07/21/2020 14:51    Procedures Procedures   Medications Ordered in ED Medications - No data to display  ED Course  I have reviewed the triage vital signs and the nursing notes.  Pertinent labs & imaging results that were available during my care of the patient were reviewed by me and considered in my medical decision making (see chart for details).    MDM Rules/Calculators/A&P                          Patient Rheu-Thritis has cleared.  Suspect that some of the abdominal discomfort and fatigue in the little bit of the loose bowel movements no significant diarrhea no bloody diarrhea probably secondary to change in normal flora after receiving Rocephin doxycycline and azithromycin.  Would recommend yogurt to help build back to normal flora.  Chest pain work-up EKG chest x-ray troponins x2 without any acute findings.  No concerns for pulmonary embolus.  Patient is not tachycardic. Final Clinical Impression(s) / ED Diagnoses Final diagnoses:  Atypical chest pain  Diarrhea, unspecified type    Rx / DC Orders ED Discharge Orders     None        Vanetta Mulders, MD 07/21/20 [phone]

## 2020-07-21 NOTE — ED Triage Notes (Signed)
Patient reports continued fatigue and diarrhea since being given zithromax x2 days ago. Patient reports he is unsure if he had a bad reaction to the zithromax or if it is related toi the Doxycycline he was taking.

## 2020-07-21 NOTE — ED Notes (Signed)
Patient reports he is also having chest pain

## 2020-07-22 ENCOUNTER — Ambulatory Visit: Payer: Self-pay | Admitting: *Deleted

## 2020-07-22 NOTE — Telephone Encounter (Signed)
I answered pt's questions regarding the troponin and BUN tests and what they were for.   His BUN was 1 point below normal so I let him know it was not anything to be concerned with.   He was seen for c/o Chest Pain and diarrhea from antibiotics he was on for a STD.   He thanked me for answering his questions.

## 2020-07-22 NOTE — Telephone Encounter (Signed)
Reason for Disposition  Health Information question, no triage required and triager able to answer question  Answer Assessment - Initial Assessment Questions 1. REASON FOR CALL or QUESTION: "What is your reason for calling today?" or "How can I best help you?" or "What question do you have that I can help answer?"     Pt seen in the ED yesterday for chest pain and diarrhea.   He was questioning what the troponin and BUN tests were for.  Protocols used: Information Only Call - No Triage-A-AH

## 2020-08-05 ENCOUNTER — Other Ambulatory Visit: Payer: Self-pay

## 2020-08-05 ENCOUNTER — Emergency Department (HOSPITAL_BASED_OUTPATIENT_CLINIC_OR_DEPARTMENT_OTHER)
Admission: EM | Admit: 2020-08-05 | Discharge: 2020-08-05 | Disposition: A | Discharge status: Home / Self Care | Payer: Medicaid Other | Attending: Emergency Medicine | Admitting: Emergency Medicine

## 2020-08-05 ENCOUNTER — Encounter (HOSPITAL_BASED_OUTPATIENT_CLINIC_OR_DEPARTMENT_OTHER): Payer: Self-pay | Admitting: Emergency Medicine

## 2020-08-05 DIAGNOSIS — F17290 Nicotine dependence, other tobacco product, uncomplicated: Secondary | ICD-10-CM | POA: Insufficient documentation

## 2020-08-05 DIAGNOSIS — Z202 Contact with and (suspected) exposure to infections with a predominantly sexual mode of transmission: Secondary | ICD-10-CM

## 2020-08-05 LAB — URINALYSIS, ROUTINE W REFLEX MICROSCOPIC
Bilirubin Urine: NEGATIVE
Glucose, UA: NEGATIVE mg/dL
Hgb urine dipstick: NEGATIVE
Ketones, ur: NEGATIVE mg/dL
Leukocytes,Ua: NEGATIVE
Nitrite: NEGATIVE
Protein, ur: NEGATIVE mg/dL
Specific Gravity, Urine: 1.005 (ref 1.005–1.030)
pH: 7 (ref 5.0–8.0)

## 2020-08-05 MED ORDER — CEFTRIAXONE SODIUM 500 MG IJ SOLR
500.0000 mg | Freq: Once | INTRAMUSCULAR | Status: AC
  Administered 2020-08-05: 500 mg via INTRAMUSCULAR
  Filled 2020-08-05: qty 500

## 2020-08-05 MED ORDER — AZITHROMYCIN 250 MG PO TABS
1000.0000 mg | ORAL_TABLET | Freq: Once | ORAL | Status: AC
  Administered 2020-08-05: 1000 mg via ORAL
  Filled 2020-08-05: qty 4

## 2020-08-05 MED ORDER — LIDOCAINE HCL (PF) 1 % IJ SOLN
1.0000 mL | Freq: Once | INTRAMUSCULAR | Status: AC
  Administered 2020-08-05: 1 mL
  Filled 2020-08-05: qty 5

## 2020-08-05 NOTE — ED Provider Notes (Signed)
[facility] EMERGENCY DEPT Provider Note   CSN: [id number] Arrival date & time: 08/05/20  1525     History Chief Complaint  Patient presents with   Cough   Exposure to STD    Todd Mcguire is a 25 y.o. male.  He was herePatient states he had sexual intercourse with a condom with a new partner about a week ago but he is having concerns he may have contracted an infection.  He has no new symptoms of infection, no lesion or rash or penile discharge but he states he is concerned.  A month ago for concern for an STD with a different partner.  He denies fevers or cough or vomiting or diarrhea or any pain at this time.      Past Medical History:  Diagnosis Date   Dizziness 05/22/2017   Heart murmur    Palpitations 05/22/2017    Patient Active Problem List   Diagnosis Date Noted   Palpitations 05/22/2017   Dizziness 05/22/2017   Heart murmur     History reviewed. No pertinent surgical history.     Family History  Problem Relation Age of Onset   Healthy Mother    Healthy Father     Social History   Tobacco Use   Smoking status: Some Days    Pack years: 0.00    Types: Cigars   Smokeless tobacco: Never  Substance Use Topics   Alcohol use: Yes    Comment: occasionally   Drug use: Not Currently    Types: Marijuana    Home Medications Prior to Admission medications   Not on File    Allergies    Flexeril [cyclobenzaprine] and Ibuprofen  Review of Systems   Review of Systems  Constitutional:  Negative for fever.  HENT:  Negative for ear pain and sore throat.   Eyes:  Negative for pain.  Respiratory:  Negative for cough.   Cardiovascular:  Negative for chest pain.  Gastrointestinal:  Negative for abdominal pain.  Genitourinary:  Negative for flank pain.  Musculoskeletal:  Negative for back pain.  Skin:  Negative for color change and rash.  Neurological:  Negative for syncope.  All other systems reviewed and are negative.  Physical  Exam Updated Vital Signs BP (!) 149/86 (BP Location: Right Arm)   Pulse (!) 58   Temp 99.3 F (37.4 C)   Resp 14   Ht 5\' 9"  (1.753 m)   Wt 77.1 kg   SpO2 100%   BMI 25.10 kg/m   Physical Exam Constitutional:      General: He is not in acute distress.    Appearance: He is well-developed.  HENT:     Head: Normocephalic.     Nose: Nose normal.  Eyes:     Extraocular Movements: Extraocular movements intact.  Cardiovascular:     Rate and Rhythm: Normal rate.  Pulmonary:     Effort: Pulmonary effort is normal.  Skin:    Coloration: Skin is not jaundiced.  Neurological:     Mental Status: He is alert. Mental status is at baseline.    ED Results / Procedures / Treatments   Labs (all labs ordered are listed, but only abnormal results are displayed) Labs Reviewed  URINALYSIS, ROUTINE W REFLEX MICROSCOPIC - Abnormal; Notable for the following components:      Result Value   Color, Urine COLORLESS (*)    All other components within normal limits  GC/CHLAMYDIA PROBE AMP ([place]) NOT AT [facility]  EKG None  Radiology No results found.  Procedures Procedures   Medications Ordered in ED Medications  azithromycin (ZITHROMAX) tablet 1,000 mg (1,000 mg Oral Given 08/05/20 1922)  cefTRIAXone (ROCEPHIN) injection 500 mg (500 mg Intramuscular Given 08/05/20 1922)  lidocaine (PF) (XYLOCAINE) 1 % injection 1 mL (1 mL Other Given 08/05/20 1922)    ED Course  I have reviewed the triage vital signs and the nursing notes.  Pertinent labs & imaging results that were available during my care of the patient were reviewed by me and considered in my medical decision making (see chart for details).    MDM Rules/Calculators/A&P                          Patient treated empirically.  Advised outpatient follow-up with his primary care doctor within the week.  Again advised him to get tested for additional STDs with his primary care doctor such as hepatitis and HIV as he is having multiple  partners.   Final Clinical Impression(s) / ED Diagnoses Final diagnoses:  Possible exposure to STD    Rx / DC Orders ED Discharge Orders     None        Cheryll Cockayne, MD 08/05/20 1949

## 2020-08-05 NOTE — ED Triage Notes (Addendum)
Cough and congestion x 4 days. Also states he would like to be checked for STD's because he received oral sex. No concerning symptoms reported.

## 2020-08-05 NOTE — ED Notes (Signed)
Pt given crackers and water. 

## 2020-08-05 NOTE — ED Notes (Signed)
Pt verbalizes understanding of discharge instructions. Opportunity for questioning and answers were provided. Armand removed by staff, pt discharged from ED to home. Educated to follow up with Mychart.

## 2020-08-09 LAB — GC/CHLAMYDIA PROBE AMP (~~LOC~~) NOT AT ARMC
Chlamydia: NEGATIVE
Comment: NEGATIVE
Comment: NORMAL
Neisseria Gonorrhea: NEGATIVE

## 2020-10-05 ENCOUNTER — Emergency Department (HOSPITAL_BASED_OUTPATIENT_CLINIC_OR_DEPARTMENT_OTHER)
Admission: EM | Admit: 2020-10-05 | Discharge: 2020-10-05 | Disposition: A | Discharge status: Home / Self Care | Payer: Medicaid Other | Attending: Emergency Medicine | Admitting: Emergency Medicine

## 2020-10-05 ENCOUNTER — Encounter (HOSPITAL_BASED_OUTPATIENT_CLINIC_OR_DEPARTMENT_OTHER): Payer: Self-pay | Admitting: *Deleted

## 2020-10-05 ENCOUNTER — Other Ambulatory Visit: Payer: Self-pay

## 2020-10-05 DIAGNOSIS — F17290 Nicotine dependence, other tobacco product, uncomplicated: Secondary | ICD-10-CM | POA: Insufficient documentation

## 2020-10-05 DIAGNOSIS — R369 Urethral discharge, unspecified: Secondary | ICD-10-CM | POA: Insufficient documentation

## 2020-10-05 MED ORDER — CEFTRIAXONE SODIUM 500 MG IJ SOLR
500.0000 mg | Freq: Once | INTRAMUSCULAR | Status: AC
  Administered 2020-10-05: 500 mg via INTRAMUSCULAR
  Filled 2020-10-05: qty 500

## 2020-10-05 MED ORDER — AZITHROMYCIN 1 G PO PACK
1.0000 g | PACK | Freq: Once | ORAL | Status: AC
  Administered 2020-10-05: 1 g via ORAL
  Filled 2020-10-05: qty 1

## 2020-10-05 NOTE — Discharge Instructions (Addendum)
Your history and exam are concerning for possible STD infection.  We sent a swab to test for infection but we will treat you empirically with antibiotics.  Please follow-up with your primary doctor for further management.  If any symptoms recur, change, or worsen, please return to the nearest emergency department.

## 2020-10-05 NOTE — ED Triage Notes (Signed)
Yellowish discharge this morning.

## 2020-10-05 NOTE — ED Provider Notes (Signed)
[facility] EMERGENCY DEPT Provider Note   CSN: [id number] Arrival date & time: 10/05/20  1753     History Chief Complaint  Patient presents with   Penile Discharge    Todd Mcguire is a 25 y.o. male.  The history is provided by the patient and medical records. No language interpreter was used.  Penile Discharge This is a recurrent problem. The current episode started 12 to 24 hours ago. The problem occurs constantly. The problem has not changed since onset.Pertinent negatives include no chest pain, no abdominal pain, no headaches and no shortness of breath. Nothing aggravates the symptoms. Nothing relieves the symptoms. He has tried nothing for the symptoms. The treatment provided no relief.      Past Medical History:  Diagnosis Date   Dizziness 05/22/2017   Heart murmur    Palpitations 05/22/2017    Patient Active Problem List   Diagnosis Date Noted   Palpitations 05/22/2017   Dizziness 05/22/2017   Heart murmur     History reviewed. No pertinent surgical history.     Family History  Problem Relation Age of Onset   Healthy Mother    Healthy Father     Social History   Tobacco Use   Smoking status: Some Days    Types: Cigars   Smokeless tobacco: Never  Vaping Use   Vaping Use: Never used  Substance Use Topics   Alcohol use: Yes    Comment: occasionally   Drug use: Not Currently    Types: Marijuana    Home Medications Prior to Admission medications   Not on File    Allergies    Flexeril [cyclobenzaprine] and Ibuprofen  Review of Systems   Review of Systems  Constitutional:  Negative for chills, fatigue and fever.  HENT:  Negative for congestion.   Respiratory:  Negative for cough, chest tightness and shortness of breath.   Cardiovascular:  Negative for chest pain.  Gastrointestinal:  Negative for abdominal pain.  Genitourinary:  Positive for penile discharge. Negative for decreased urine volume, difficulty urinating, dysuria,  flank pain, frequency, hematuria, penile pain, penile swelling, scrotal swelling, testicular pain and urgency.  Musculoskeletal:  Negative for back pain.  Neurological:  Negative for light-headedness and headaches.  Psychiatric/Behavioral:  Negative for agitation.   All other systems reviewed and are negative.  Physical Exam Updated Vital Signs BP 124/74 (BP Location: Right Arm)   Pulse 69   Temp 99.1 F (37.3 C)   Resp 12   Ht 5\' 10"  (1.778 m)   Wt 77.1 kg   SpO2 100%   BMI 24.39 kg/m   Physical Exam Vitals and nursing note reviewed.  Constitutional:      General: He is not in acute distress.    Appearance: He is well-developed. He is not ill-appearing, toxic-appearing or diaphoretic.  HENT:     Head: Normocephalic and atraumatic.  Eyes:     Conjunctiva/sclera: Conjunctivae normal.  Cardiovascular:     Rate and Rhythm: Normal rate and regular rhythm.     Heart sounds: No murmur heard. Pulmonary:     Effort: Pulmonary effort is normal. No respiratory distress.     Breath sounds: Normal breath sounds.  Abdominal:     General: Abdomen is flat.     Palpations: Abdomen is soft.     Tenderness: There is no abdominal tenderness.  Genitourinary:    Comments: Deferred by patient with no rash reported  Musculoskeletal:     Cervical back: Neck supple.  Skin:  General: Skin is warm and dry.  Neurological:     Mental Status: He is alert.  Psychiatric:        Mood and Affect: Mood normal.    ED Results / Procedures / Treatments   Labs (all labs ordered are listed, but only abnormal results are displayed) Labs Reviewed  GC/CHLAMYDIA PROBE AMP ([place]) NOT AT [facility]    EKG None  Radiology No results found.  Procedures Procedures   Medications Ordered in ED Medications  azithromycin (ZITHROMAX) powder 1 g (1 g Oral Given 10/05/20 2135)  cefTRIAXone (ROCEPHIN) injection 500 mg (500 mg Intramuscular Given 10/05/20 2135)    ED Course  I have reviewed the  triage vital signs and the nursing notes.  Pertinent labs & imaging results that were available during my care of the patient were reviewed by me and considered in my medical decision making (see chart for details).    MDM Rules/Calculators/A&P                           Todd Mcguire is a 25 y.o. male with a past medical history significant for previous presumptive STI who presents with penile discharge.  He reports that he has had new sexual partner recently and today noticed penile discharge.  He denies any testicle pain, groin pain, or rashes.  Denies any abdominal pain, flank pain, or back pain.  Denies fevers, chills, nausea, vomiting, or bowel changes.  He reports this is similar when he had symptoms a month ago and was treated empirically with antibiotics for STI.  He reports he cannot tolerate doxycycline but presents for evaluation.  On exam, lungs clear and chest nontender.  Abdomen nontender.  Patient otherwise well-appearing.  GU exam was offered but given his lack of symptoms he felt he did not need a GU inspection at this time.  Exam otherwise unremarkable.  Due to his history and description of symptoms, we will swab him for GC/chlamydia and given empiric treatment for STI with IM Rocephin and oral azithromycin per the new guidelines as he cannot tolerate doxycycline.  Patient will be discharged to follow-up with PCP and understood return precautions.  He had no other questions or concerns and was discharged in good condition   Final Clinical Impression(s) / ED Diagnoses Final diagnoses:  Penile discharge    Rx / DC Orders ED Discharge Orders     None       Clinical Impression: 1. Penile discharge     Disposition: Discharge  Condition: Good  I have discussed the results, Dx and Tx plan with the pt(& family if present). He/she/they expressed understanding and agree(s) with the plan. Discharge instructions discussed at great length. Strict return precautions  discussed and pt &/or family have verbalized understanding of the instructions. No further questions at time of discharge.    New Prescriptions   No medications on file    Follow Up: [facility] [address] [place] [address] [phone] Schedule an appointment as soon as possible for a visit    [facility] [address] [place] [address] [phone]        Latrease Kunde, Canary Brim, MD 10/05/20 2138

## 2020-10-07 LAB — GC/CHLAMYDIA PROBE AMP (~~LOC~~) NOT AT ARMC
Chlamydia: NEGATIVE
Comment: NEGATIVE
Comment: NORMAL
Neisseria Gonorrhea: NEGATIVE

## 2020-11-21 ENCOUNTER — Encounter: Payer: Self-pay | Admitting: Emergency Medicine

## 2020-11-21 ENCOUNTER — Other Ambulatory Visit: Payer: Self-pay

## 2020-11-21 ENCOUNTER — Ambulatory Visit
Admission: EM | Admit: 2020-11-21 | Discharge: 2020-11-21 | Disposition: A | Discharge status: Home / Self Care | Payer: Medicaid Other | Attending: Internal Medicine | Admitting: Internal Medicine

## 2020-11-21 DIAGNOSIS — R300 Dysuria: Secondary | ICD-10-CM | POA: Insufficient documentation

## 2020-11-21 DIAGNOSIS — Z113 Encounter for screening for infections with a predominantly sexual mode of transmission: Secondary | ICD-10-CM | POA: Insufficient documentation

## 2020-11-21 LAB — POCT URINALYSIS DIP (MANUAL ENTRY)
Bilirubin, UA: NEGATIVE
Blood, UA: NEGATIVE
Glucose, UA: NEGATIVE mg/dL
Ketones, POC UA: NEGATIVE mg/dL
Leukocytes, UA: NEGATIVE
Nitrite, UA: NEGATIVE
Protein Ur, POC: NEGATIVE mg/dL
Spec Grav, UA: 1.025 (ref 1.010–1.025)
Urobilinogen, UA: 0.2 E.U./dL
pH, UA: 7 (ref 5.0–8.0)

## 2020-11-21 NOTE — ED Provider Notes (Signed)
[facility]    CSN: [id number] Arrival date & time: 11/21/20  0947      History   Chief Complaint Chief Complaint  Patient presents with   Dysuria    HPI Todd Mcguire is a 25 y.o. male.   Patient presents with urinary burning that started 3 days ago.  Denies penile discharge, urinary frequency, testicular pain, pelvic pain, abdominal pain, fever, back pain, any penile lesions.  Denies any known exposure to STD and has not had unprotected sexual intercourse recently.   Dysuria  Past Medical History:  Diagnosis Date   Dizziness 05/22/2017   Heart murmur    Palpitations 05/22/2017    Patient Active Problem List   Diagnosis Date Noted   Palpitations 05/22/2017   Dizziness 05/22/2017   Heart murmur     History reviewed. No pertinent surgical history.     Home Medications    Prior to Admission medications   Not on File    Family History Family History  Problem Relation Age of Onset   Healthy Mother    Healthy Father     Social History Social History   Tobacco Use   Smoking status: Some Days    Types: Cigars   Smokeless tobacco: Never  Vaping Use   Vaping Use: Never used  Substance Use Topics   Alcohol use: Yes    Comment: occasionally   Drug use: Not Currently    Types: Marijuana     Allergies   Flexeril [cyclobenzaprine] and Ibuprofen   Review of Systems Review of Systems Per HPI  Physical Exam Triage Vital Signs ED Triage Vitals [11/21/20 1153]  Enc Vitals Group     BP 132/72     Pulse Rate 61     Resp 16     Temp 98.1 F (36.7 C)     Temp Source Oral     SpO2 96 %     Weight      Height      Head Circumference      Peak Flow      Pain Score 3     Pain Loc      Pain Edu?      Excl. in GC?    No data found.  Updated Vital Signs BP 132/72 (BP Location: Left Arm)   Pulse 61   Temp 98.1 F (36.7 C) (Oral)   Resp 16   SpO2 96%   Visual Acuity Right Eye Distance:   Left Eye Distance:   Bilateral  Distance:    Right Eye Near:   Left Eye Near:    Bilateral Near:     Physical Exam Constitutional:      General: He is not in acute distress.    Appearance: Normal appearance. He is not toxic-appearing or diaphoretic.  HENT:     Head: Normocephalic and atraumatic.  Eyes:     Extraocular Movements: Extraocular movements intact.     Conjunctiva/sclera: Conjunctivae normal.  Cardiovascular:     Rate and Rhythm: Normal rate and regular rhythm.     Pulses: Normal pulses.     Heart sounds: Normal heart sounds.  Pulmonary:     Effort: Pulmonary effort is normal. No respiratory distress.     Breath sounds: Normal breath sounds.  Abdominal:     General: Abdomen is flat. Bowel sounds are normal. There is no distension.     Palpations: Abdomen is soft.     Tenderness: There is no abdominal tenderness.  Genitourinary:    Comments: Deferred with shared decision making.  Self swab performed. Skin:    General: Skin is warm and dry.  Neurological:     General: No focal deficit present.     Mental Status: He is alert and oriented to person, place, and time. Mental status is at baseline.  Psychiatric:        Mood and Affect: Mood normal.        Behavior: Behavior normal.        Thought Content: Thought content normal.        Judgment: Judgment normal.     UC Treatments / Results  Labs (all labs ordered are listed, but only abnormal results are displayed) Labs Reviewed  POCT URINALYSIS DIP (MANUAL ENTRY) - Abnormal; Notable for the following components:      Result Value   Clarity, UA hazy (*)    All other components within normal limits  URINE CULTURE  CYTOLOGY, (ORAL, ANAL, URETHRAL) ANCILLARY ONLY    EKG   Radiology No results found.  Procedures Procedures (including critical care time)  Medications Ordered in UC Medications - No data to display  Initial Impression / Assessment and Plan / UC Course  I have reviewed the triage vital signs and the nursing  notes.  Pertinent labs & imaging results that were available during my care of the patient were reviewed by me and considered in my medical decision making (see chart for details).     Urinalysis did not show signs of urinary tract infection.  Highly suspicious of sexually transmitted disease given patient's age and symptoms.  Cytology swab pending.  Urine culture pending due to patient declining exposure to STD.  Will await results of urine culture and cytology swab for treatment.  No red flags seen on exam.Discussed strict return precautions. Patient verbalized understanding and is agreeable with plan.  Final Clinical Impressions(s) / UC Diagnoses   Final diagnoses:  Dysuria  Screening examination for venereal disease     Discharge Instructions      Your urine did not show signs of urinary tract infection.  Urine culture and swab are pending.  We will call if they are positive.  Please refrain from sexual activity until test results are complete.     ED Prescriptions   None    PDMP not reviewed this encounter.   Lance Muss, FNP 11/21/20 1336

## 2020-11-21 NOTE — ED Triage Notes (Signed)
States 3 days ago began to have burning with urination. Denies fever, lower abdominal pain, discharge, recent unprotected sex, rash. Requesting STD check

## 2020-11-21 NOTE — Discharge Instructions (Addendum)
Your urine did not show signs of urinary tract infection.  Urine culture and swab are pending.  We will call if they are positive.  Please refrain from sexual activity until test results are complete.

## 2020-11-22 LAB — CYTOLOGY, (ORAL, ANAL, URETHRAL) ANCILLARY ONLY
Chlamydia: NEGATIVE
Comment: NEGATIVE
Comment: NEGATIVE
Comment: NORMAL
Neisseria Gonorrhea: NEGATIVE
Trichomonas: NEGATIVE

## 2020-11-22 LAB — URINE CULTURE: Culture: NO GROWTH

## 2021-02-13 ENCOUNTER — Encounter (HOSPITAL_BASED_OUTPATIENT_CLINIC_OR_DEPARTMENT_OTHER): Payer: Self-pay

## 2021-02-13 ENCOUNTER — Other Ambulatory Visit: Payer: Self-pay

## 2021-02-13 ENCOUNTER — Emergency Department (HOSPITAL_BASED_OUTPATIENT_CLINIC_OR_DEPARTMENT_OTHER)
Admission: EM | Admit: 2021-02-13 | Discharge: 2021-02-13 | Disposition: A | Discharge status: Home / Self Care | Payer: Medicaid Other | Attending: Emergency Medicine | Admitting: Emergency Medicine

## 2021-02-13 DIAGNOSIS — Z202 Contact with and (suspected) exposure to infections with a predominantly sexual mode of transmission: Secondary | ICD-10-CM | POA: Insufficient documentation

## 2021-02-13 LAB — URINALYSIS, ROUTINE W REFLEX MICROSCOPIC
Bilirubin Urine: NEGATIVE
Glucose, UA: NEGATIVE mg/dL
Hgb urine dipstick: NEGATIVE
Ketones, ur: NEGATIVE mg/dL
Leukocytes,Ua: NEGATIVE
Nitrite: NEGATIVE
Specific Gravity, Urine: 1.025 (ref 1.005–1.030)
pH: 7.5 (ref 5.0–8.0)

## 2021-02-13 NOTE — ED Provider Notes (Signed)
Emergency Department Provider Note   I have reviewed the triage vital signs and the nursing notes.   HISTORY  Chief Complaint SEXUALLY TRANSMITTED DISEASE   HPI TULLIO CHAUSSE is a 26 y.o. male with past medical history reviewed below presents to the emergency department for evaluation of "tingling" in the tip of the penis. Denies any discharge. No known STI or obvious high risk exposure. He is sezually active with condoms. Decrease last HIV test less than 6 months prior. No urine hesitancy or urgency. No fever or back pain.     Past Medical History:  Diagnosis Date   Dizziness 05/22/2017   Heart murmur    Palpitations 05/22/2017    Review of Systems  Constitutional: No fever/chills Eyes: No visual changes. ENT: No sore throat. Cardiovascular: Denies chest pain. Respiratory: Denies shortness of breath. Gastrointestinal: No abdominal pain.  No nausea, no vomiting.  No diarrhea.  No constipation. Genitourinary: Positive for dysuria. Musculoskeletal: Negative for back pain. Skin: Negative for rash. Neurological: Negative for headaches, focal weakness or numbness.  10-point ROS otherwise negative.  ____________________________________________   PHYSICAL EXAM:  VITAL SIGNS: ED Triage Vitals [02/13/21 1414]  Enc Vitals Group     BP 139/79     Pulse Rate (!) 54     Resp 16     Temp 98.5 F (36.9 C)     Temp Source Oral     SpO2 100 %     Weight 150 lb (68 kg)     Height 5\' 10"  (1.778 m)   Constitutional: Alert and oriented. Well appearing and in no acute distress. Eyes: Conjunctivae are normal. Head: Atraumatic. Nose: No congestion/rhinnorhea. Mouth/Throat: Mucous membranes are moist.  Neck: No stridor.   Cardiovascular: Normal rate, regular rhythm. Good peripheral circulation. Grossly normal heart sounds.   Respiratory: Normal respiratory effort.  No retractions. Lungs CTAB. Gastrointestinal: Soft and nontender. No distention.  GU: Exam performed with  patient's verbal consent and nurse chaperone.  No urethral discharge or irritation.  Normal external genitalia.  No testicular tenderness or swelling.  No scrotal cellulitis. Musculoskeletal: No lower extremity tenderness nor edema. No gross deformities of extremities. Neurologic:  Normal speech and language. No gross focal neurologic deficits are appreciated.  Skin:  Skin is warm, dry and intact. No rash noted.   ____________________________________________   LABS (all labs ordered are listed, but only abnormal results are displayed)  Labs Reviewed  URINALYSIS, ROUTINE W REFLEX MICROSCOPIC - Abnormal; Notable for the following components:      Result Value   APPearance HAZY (*)    Protein, ur TRACE (*)    All other components within normal limits  GC/CHLAMYDIA PROBE AMP ([place]) NOT AT [facility]    ____________________________________________   PROCEDURES  Procedure(s) performed:   Procedures  None  ____________________________________________   INITIAL IMPRESSION / ASSESSMENT AND PLAN / ED COURSE  Pertinent labs & imaging results that were available during my care of the patient were reviewed by me and considered in my medical decision making (see chart for details).   This patient is Presenting for Evaluation of dysuria, which does require a range of treatment options, and is a complaint that involves a moderate risk of morbidity and mortality.  The Differential Diagnoses include UTI, STI, orchitis, torsion.   Clinical Laboratory Tests Ordered, included UA and urine gonorrhea and chlamydia.   Radiologic Tests Ordered considered performing abdominal imaging and/or [facility] but no abdominal tenderness and no testicle pain and swelling.  Medical Decision Making: Summary:  Patient with mild dysuria.  No evidence of urinary tract infection on UA.  Gonorrhea and Chlamydia testing sent and patient will follow the test results in the MyChart app.  Discussed barrier protection  versus abstinence pending testing results and will alert other partners that he is undergoing testing. Patient defers HIV testing for now.   Disposition: Discharge  ____________________________________________  FINAL CLINICAL IMPRESSION(S) / ED DIAGNOSES  Final diagnoses:  Potential exposure to STD    Note:  This document was prepared using Dragon voice recognition software and may include unintentional dictation errors.  Alona Bene, MD, [facility] Emergency Medicine    Arihant Pennings, Arlyss Repress, MD 02/16/21 [phone]

## 2021-02-13 NOTE — Discharge Instructions (Signed)
We are testing you for sexually transmitted diseases. You can follow the test results in the MyChart app. We will call if you need to return for antibiotics. Please use condoms or abstain from sex until your results are back and they are either negative or you have been fully treated.   Please follow up with your doctor as soon as possible regarding todays ED visit and your symptoms.   Return to the ED if your pain worsens, you develop a fever, or for any other symptoms that concern you.

## 2021-02-13 NOTE — ED Notes (Signed)
Pt provided discharge instructions and prescription information. Pt was given the opportunity to ask questions and questions were answered. Discharge signature not obtained in the setting of the COVID-19 pandemic in order to reduce high touch surfaces.  ° °

## 2021-02-13 NOTE — ED Triage Notes (Signed)
Pt c/o "tingling" to his penis x 3 days. Pt concerned for STD. Pt reports urinary frequency. Denies penile discharge or lesions. Pt is sexually active with single partner x 3 months and reports the use of barrier protection.

## 2021-02-14 LAB — GC/CHLAMYDIA PROBE AMP (~~LOC~~) NOT AT ARMC
Chlamydia: NEGATIVE
Comment: NEGATIVE
Comment: NORMAL
Neisseria Gonorrhea: NEGATIVE

## 2021-02-16 ENCOUNTER — Encounter (HOSPITAL_BASED_OUTPATIENT_CLINIC_OR_DEPARTMENT_OTHER): Payer: Self-pay | Admitting: Emergency Medicine

## 2021-02-16 ENCOUNTER — Emergency Department (HOSPITAL_BASED_OUTPATIENT_CLINIC_OR_DEPARTMENT_OTHER)
Admission: EM | Admit: 2021-02-16 | Discharge: 2021-02-16 | Disposition: A | Discharge status: Home / Self Care | Payer: Medicaid Other | Attending: Emergency Medicine | Admitting: Emergency Medicine

## 2021-02-16 ENCOUNTER — Other Ambulatory Visit: Payer: Self-pay

## 2021-02-16 DIAGNOSIS — Z202 Contact with and (suspected) exposure to infections with a predominantly sexual mode of transmission: Secondary | ICD-10-CM | POA: Insufficient documentation

## 2021-02-16 DIAGNOSIS — R369 Urethral discharge, unspecified: Secondary | ICD-10-CM | POA: Insufficient documentation

## 2021-02-16 LAB — URINALYSIS, ROUTINE W REFLEX MICROSCOPIC
Bilirubin Urine: NEGATIVE
Glucose, UA: NEGATIVE mg/dL
Hgb urine dipstick: NEGATIVE
Ketones, ur: NEGATIVE mg/dL
Nitrite: NEGATIVE
Protein, ur: NEGATIVE mg/dL
Specific Gravity, Urine: 1.011 (ref 1.005–1.030)
pH: 7.5 (ref 5.0–8.0)

## 2021-02-16 MED ORDER — AZITHROMYCIN 1 G PO PACK
1.0000 g | PACK | Freq: Once | ORAL | Status: AC
  Administered 2021-02-16: 1 g via ORAL
  Filled 2021-02-16: qty 1

## 2021-02-16 MED ORDER — CEFTRIAXONE SODIUM 1 G IJ SOLR
1.0000 g | Freq: Once | INTRAMUSCULAR | Status: AC
  Administered 2021-02-16: 1 g via INTRAMUSCULAR
  Filled 2021-02-16: qty 10

## 2021-02-16 NOTE — ED Provider Notes (Signed)
[place] EMERGENCY DEPT Provider Note   CSN: [id number] Arrival date & time: 02/16/21  1321     History  Chief Complaint  Patient presents with   Exposure to STD    Todd Mcguire is a 26 y.o. male seen 3 days ago in the ED presenting today with complaint of dysuria and penile discharge.  Patient reports he is sexually active with 1 male.  3 days ago he was having numbness in his penis however now he has begun to have difficulty urinating, dysuria and thicker penile discharge that he describes as clear.  No testicular pain or discomfort.   Exposure to STD      Home Medications Prior to Admission medications   Not on File      Allergies    Flexeril [cyclobenzaprine] and Ibuprofen    Review of Systems   Review of Systems  Physical Exam Updated Vital Signs BP 136/68    Pulse 60    Temp 98.9 F (37.2 C)    Resp 16    Ht 5\' 10"  (1.778 m)    Wt 68.4 kg    SpO2 100%    BMI 21.64 kg/m  Physical Exam Vitals and nursing note reviewed.  Constitutional:      Appearance: Normal appearance.  HENT:     Head: Normocephalic and atraumatic.  Eyes:     General: No scleral icterus.    Conjunctiva/sclera: Conjunctivae normal.  Pulmonary:     Effort: Pulmonary effort is normal. No respiratory distress.  Genitourinary:    Comments: GU exam declined Skin:    Findings: No rash.  Neurological:     Mental Status: He is alert.  Psychiatric:        Mood and Affect: Mood normal.    ED Results / Procedures / Treatments   Labs (all labs ordered are listed, but only abnormal results are displayed) Labs Reviewed  URINALYSIS, ROUTINE W REFLEX MICROSCOPIC  GC/CHLAMYDIA PROBE AMP ([place]) NOT AT [facility]    EKG None  Radiology No results found.  Procedures Procedures    Medications Ordered in ED Medications  cefTRIAXone (ROCEPHIN) injection 1 g (has no administration in time range)  azithromycin (ZITHROMAX) powder 1 g (has no administration in time  range)    ED Course/ Medical Decision Making/ A&P                           Medical Decision Making  26 year old male presenting for STD check.  Was seen 3 days ago.  Ddx: STD, UTI, epididymitis  Testing: UA, gc/chlamydia  Tx: Patient was not empirically treated in his visit on 1/9.  Will be given Rocephin shot in the department. Says he does not want to take doxycycline due to stomach upset.  Azithromycin given in the department as a one-time dose.    Results: Patient understands that his STD testing will not come back today.  He will follow-up with the results in his chart.  Final Clinical Impression(s) / ED Diagnoses Final diagnoses:  Penile discharge    Rx / DC Orders Results and diagnoses were explained to the patient. Return precautions discussed in full. Patient had no additional questions and expressed complete understanding.   This chart was dictated using voice recognition software.  Despite best efforts to proofread,  errors can occur which can change the documentation meaning.    Rhae Hammock, PA-C 02/16/21 1918    Lorelle Gibbs, DO 02/25/21  1517 ° °

## 2021-02-16 NOTE — Discharge Instructions (Addendum)
Please abstain for sexual activity for the next 7 to 10 days.  Follow-up on your chlamydia and gonorrhea results in your chart.

## 2021-02-16 NOTE — ED Triage Notes (Signed)
States was seen 2 days ago for STD check and now is having a penile d/c , hurts to pee

## 2021-02-17 LAB — GC/CHLAMYDIA PROBE AMP (~~LOC~~) NOT AT ARMC
Chlamydia: NEGATIVE
Comment: NEGATIVE
Comment: NORMAL
Neisseria Gonorrhea: NEGATIVE

## 2021-04-18 ENCOUNTER — Emergency Department (HOSPITAL_COMMUNITY): Payer: Self-pay

## 2021-04-18 ENCOUNTER — Encounter (HOSPITAL_COMMUNITY): Payer: Self-pay | Admitting: Emergency Medicine

## 2021-04-18 ENCOUNTER — Other Ambulatory Visit: Payer: Self-pay

## 2021-04-18 ENCOUNTER — Emergency Department (HOSPITAL_COMMUNITY)
Admission: EM | Admit: 2021-04-18 | Discharge: 2021-04-18 | Disposition: A | Discharge status: Home / Self Care | Payer: Self-pay | Attending: Emergency Medicine | Admitting: Emergency Medicine

## 2021-04-18 DIAGNOSIS — Y9367 Activity, basketball: Secondary | ICD-10-CM | POA: Insufficient documentation

## 2021-04-18 DIAGNOSIS — M79661 Pain in right lower leg: Secondary | ICD-10-CM | POA: Insufficient documentation

## 2021-04-18 NOTE — ED Notes (Signed)
Patient discharge instructions reviewed with the patient. The patient verbalized understanding of instructions. Patient discharged. 

## 2021-04-18 NOTE — ED Triage Notes (Signed)
Pt. Stated, I was playing basketball yesterday and got hit and there's a knot on my rt. Upper leg before the knee. ?

## 2021-04-18 NOTE — Discharge Instructions (Signed)
Recommend supportive treatment at home. Rest your leg, use Ice, You can use a knee brace for compression, Avoid strenuous activity until symptoms improve. Use tylenol or Ibuprofen for pain. If it does not start improving within a week, I have provided you a Sports Medicine referral to have your leg looked at again.  ?

## 2021-04-18 NOTE — ED Provider Notes (Signed)
?[place] ?Provider Note ? ? ?CSN: [id number] ?Arrival date & time: 04/18/21  1022 ? ?  ? ?History ? ?Chief Complaint  ?Patient presents with  ? Leg Pain  ? ? ?Todd Mcguire is a 26 y.o. male. ?Patient presents the emergency department after a basketball injury that he had yesterday.  He says he was running when the right knee started suddenly hurting.  He does not remember getting specifically hit in the right knee or falling on it.  He says that ever since that happened he has had a knot on the anterior side of the proximal side right tibia.  He has been weightbearing but this has been limited.  He has pain with range of motion of his right knee.  He denies any numbness or tingling of his lower leg.  He denies any leg swelling. ? ? ?Leg Pain ? ?  ? ?Home Medications ?Prior to Admission medications   ?Not on File  ?   ? ?Allergies    ?Flexeril [cyclobenzaprine] and Ibuprofen   ? ?Review of Systems   ?Review of Systems  ?Musculoskeletal:  Positive for arthralgias.  ?All other systems reviewed and are negative. ? ?Physical Exam ?Updated Vital Signs ?BP 128/79 (BP Location: Right Arm)   Pulse (!) 56   Temp 98.3 ?F (36.8 ?C) (Oral)   Resp 16   SpO2 100%  ?Physical Exam ?Vitals and nursing note reviewed.  ?Constitutional:   ?   General: He is not in acute distress. ?   Appearance: Normal appearance. He is well-developed. He is not ill-appearing, toxic-appearing or diaphoretic.  ?HENT:  ?   Head: Normocephalic and atraumatic.  ?   Nose: No nasal deformity.  ?   Mouth/Throat:  ?   Lips: Pink. No lesions.  ?Eyes:  ?   General: Gaze aligned appropriately. No scleral icterus.    ?   Right eye: No discharge.     ?   Left eye: No discharge.  ?   Conjunctiva/sclera: Conjunctivae normal.  ?   Right eye: Right conjunctiva is not injected. No exudate or hemorrhage. ?   Left eye: Left conjunctiva is not injected. No exudate or hemorrhage. ?Pulmonary:  ?   Effort: Pulmonary effort is  normal. No respiratory distress.  ?Musculoskeletal:  ?     Legs: ? ?   Comments: Right knee: There is tenderness and a knot at the tibial plateau. No swelling. Able to range knee but it is painful. Pedal and tibial dorsalis pulses 2+.  Sensation intact.   ?Skin: ?   General: Skin is warm and dry.  ?Neurological:  ?   Mental Status: He is alert and oriented to person, place, and time.  ?Psychiatric:     ?   Mood and Affect: Mood normal.     ?   Speech: Speech normal.     ?   Behavior: Behavior normal. Behavior is cooperative.  ? ? ?ED Results / Procedures / Treatments   ?Labs ?(all labs ordered are listed, but only abnormal results are displayed) ?Labs Reviewed - No data to display ? ?EKG ?None ? ?Radiology ?DG Knee Complete 4 Views Right ? ?Result Date: 04/18/2021 ?CLINICAL DATA:  Basketball injury EXAM: RIGHT KNEE - COMPLETE 4+ VIEW COMPARISON:  None. FINDINGS: No evidence of fracture, dislocation, or joint effusion. No evidence of arthropathy or other focal bone abnormality. Soft tissues are unremarkable. IMPRESSION: Negative. Electronically Signed   By: Addison Lank.D.  On: 04/18/2021 12:13   ? ?Procedures ?Procedures  ? ? ?Medications Ordered in ED ?Medications - No data to display ? ?ED Course/ Medical Decision Making/ A&P ?  ?                        ?Medical Decision Making ?Amount and/or Complexity of Data Reviewed ?Radiology: ordered. ? ? ? ?MDM  ?This is a 26 y.o. male who presents to the ED with right knee pain in the setting of a basketball injury ?Vitals stable. Exam with knot and tenderness overlying the tibial plateau. ROM mostly intact, but painful. Neurovascularly intact.   ?Will obtain x ray ? ?I personally ordered, reviewed, and interpreted all laboratory work and imaging and agree with radiologist interpretation. Results interpreted below: xray negative ? ?No fracture evident today. I do not think that this is soft tissue related. He could have a shin splint versus contusion. Recommend  supportive tx at home. Will provide with Sports Medicine f/u ? ? ?Charting Requirements ?Additional history is obtained from:  Independent historian ?External Records from outside source obtained and reviewed including: n/a ?Social Determinants of Health:  none ?Pertinant PMH that complicates patient's illness: n/a ? ?Patient Care ?Problems that were addressed during this visit: ?- Right knee pain: Acute illness ?Disposition: sports med f/u, supportive tx ? ?Portions of this note were generated with [employer]. Dictation errors may occur despite best attempts at proofreading. ?  ? ?Final Clinical Impression(s) / ED Diagnoses ?Final diagnoses:  ?Pain in right shin  ? ? ?Rx / DC Orders ?ED Discharge Orders   ? ? None  ? ?  ? ? ?  ?Adolphus Birchwood, PA-C ?04/18/21 1558 ? ?  ?Regan Lemming, MD ?04/18/21 1807 ? ?

## 2021-06-01 ENCOUNTER — Other Ambulatory Visit: Payer: Self-pay

## 2021-06-01 ENCOUNTER — Emergency Department (HOSPITAL_BASED_OUTPATIENT_CLINIC_OR_DEPARTMENT_OTHER)
Admission: EM | Admit: 2021-06-01 | Discharge: 2021-06-01 | Disposition: A | Discharge status: Home / Self Care | Payer: Medicaid Other | Attending: Emergency Medicine | Admitting: Emergency Medicine

## 2021-06-01 ENCOUNTER — Encounter (HOSPITAL_BASED_OUTPATIENT_CLINIC_OR_DEPARTMENT_OTHER): Payer: Self-pay | Admitting: Emergency Medicine

## 2021-06-01 DIAGNOSIS — R300 Dysuria: Secondary | ICD-10-CM | POA: Insufficient documentation

## 2021-06-01 DIAGNOSIS — R369 Urethral discharge, unspecified: Secondary | ICD-10-CM | POA: Insufficient documentation

## 2021-06-01 LAB — URINALYSIS, ROUTINE W REFLEX MICROSCOPIC
Bilirubin Urine: NEGATIVE
Glucose, UA: NEGATIVE mg/dL
Hgb urine dipstick: NEGATIVE
Ketones, ur: NEGATIVE mg/dL
Leukocytes,Ua: NEGATIVE
Nitrite: NEGATIVE
Protein, ur: NEGATIVE mg/dL
Specific Gravity, Urine: 1.019 (ref 1.005–1.030)
pH: 7.5 (ref 5.0–8.0)

## 2021-06-01 MED ORDER — AZITHROMYCIN 250 MG PO TABS
1000.0000 mg | ORAL_TABLET | Freq: Once | ORAL | Status: AC
  Administered 2021-06-01: 1000 mg via ORAL
  Filled 2021-06-01: qty 4

## 2021-06-01 MED ORDER — LIDOCAINE HCL (PF) 1 % IJ SOLN
INTRAMUSCULAR | Status: AC
  Filled 2021-06-01: qty 5

## 2021-06-01 MED ORDER — CEFTRIAXONE SODIUM 500 MG IJ SOLR
500.0000 mg | Freq: Once | INTRAMUSCULAR | Status: AC
  Administered 2021-06-01: 500 mg via INTRAMUSCULAR
  Filled 2021-06-01: qty 500

## 2021-06-01 NOTE — ED Triage Notes (Signed)
Pt c/o of discharge and tingling with urination for the past week.  ?

## 2021-06-01 NOTE — Discharge Instructions (Signed)
Please take a few minutes to read these important discharge instructions below: You have been diagnosed with and treated for a presumed sexually transmitted infection such as chlamydia, gonorrhea, or trichomoniasis.  Chlamydia and gonorrhea are the most commonly reported communicable diseases in the [place]. They are especially common among people younger than 25 years of age. Infection without symptoms is common in men and women. Your partner may be infected but not display symptoms.  Complications of these infections without treatment include serious pelvic infections, ectopic pregnancy, and infertility. Other infections such as HIV or HPV, which can occur in patients with sexually transmitted infections, can lead to cancer or death. To minimize the spread of the infection, you should avoid sexual intercourse for 7 days or until symptoms have resolved.  To minimize the spread of the infection, you should advise your sexual partner(s) to be evaluated, tested and treated. This includes all sexual partners within the past 60 days or your last sexual partner if last contact was greater than 60 days.  To minimize the risk of reinfection, you should abstain from sexual intercourse until your sexual partners have been tested and treated.  Consistent condom use is important in preventing the spread of sexually transmitted infections.  Do you need to get re-tested? Studies reveal that infection with chlamydia or gonorrhea is common among those treated within the preceding several months. Testing for gonorrhea and chlamydia in approximately 3 months is recommended even if you believe your sexual partner has been treated. Based on your history or demographics, your doctor may feel that you are higher risk for HIV and syphilis.  Talk to your doctor about whether you should be tested for these diseases at this time.  When should you come back to the Emergency Department? If your symptoms have not  improved after 48 hours from when you received your antibiotics, you should contact your primary care doctor or return to the Emergency Department.  These may be signs of an infection that has not been fully treated. If you develop worsening pain, worsening discharge from your penis or vagina, fevers of greater than 100.4 F, or have any other new or alarming symptoms, you should return promptly to the Emergency Department.  You should follow up with your primary care doctor's office in 1 week to ensure that your symptoms have resolved.  

## 2021-06-01 NOTE — ED Notes (Signed)
Discharge paperwork given and understood. 

## 2021-06-01 NOTE — ED Provider Notes (Signed)
?[facility] ?Provider Note ? ? ?CSN: [id number] ?Arrival date & time: 06/01/21  1218 ? ?  ? ?History ? ?Chief Complaint  ?Patient presents with  ? Exposure to STD  ? ? ?Todd Mcguire is a 26 y.o. male presenting to the ED with concern for penile drainage and mild dysuria.  This been going ongoing for about a week.  He has had similar symptoms in the past but cannot recall the inciting event, says he was tested for STIs but they were negative.  He does have a new male partner, reports that he does use barrier protection and condoms during intercourse.  He denies any other lesions or ulcerations of the penis or the scrotum.  He denies fevers.  He otherwise is well-appearing ? ?HPI ? ?  ? ?Home Medications ?Prior to Admission medications   ?Not on File  ?   ? ?Allergies    ?Cephalexin, Flexeril [cyclobenzaprine], and Ibuprofen   ? ?Review of Systems   ?Review of Systems ? ?Physical Exam ?Updated Vital Signs ?BP 133/72 (BP Location: Right Arm)   Pulse 63   Temp 98.4 ?F (36.9 ?C)   Resp 18   Ht 5\' 10"  (1.778 m)   Wt 71.7 kg   SpO2 100%   BMI 22.67 kg/m?  ?Physical Exam ?Constitutional:   ?   General: He is not in acute distress. ?HENT:  ?   Head: Normocephalic and atraumatic.  ?Eyes:  ?   Conjunctiva/sclera: Conjunctivae normal.  ?   Pupils: Pupils are equal, round, and reactive to light.  ?Cardiovascular:  ?   Rate and Rhythm: Normal rate and regular rhythm.  ?Pulmonary:  ?   Effort: Pulmonary effort is normal. No respiratory distress.  ?Skin: ?   General: Skin is warm and dry.  ?Neurological:  ?   General: No focal deficit present.  ?   Mental Status: He is alert. Mental status is at baseline.  ?Psychiatric:     ?   Mood and Affect: Mood normal.     ?   Behavior: Behavior normal.  ? ? ?ED Results / Procedures / Treatments   ?Labs ?(all labs ordered are listed, but only abnormal results are displayed) ?Labs Reviewed  ?URINALYSIS, ROUTINE W REFLEX MICROSCOPIC  ?GC/CHLAMYDIA PROBE  AMP ([place]) NOT AT [facility]  ? ? ?EKG ?None ? ?Radiology ?No results found. ? ?Procedures ?Procedures  ? ? ?Medications Ordered in ED ?Medications  ?cefTRIAXone (ROCEPHIN) injection 500 mg (has no administration in time range)  ?azithromycin (ZITHROMAX) tablet 1,000 mg (has no administration in time range)  ? ? ?ED Course/ Medical Decision Making/ A&P ?  ?                        ?Medical Decision Making ?Amount and/or Complexity of Data Reviewed ?Labs: ordered. ? ?Risk ?Prescription drug management. ? ? ?Patient is here with dysuria and penile drainage, has been seen in the past for this issue numerous times.  UA does not show evidence of infection.  We will send GC chlamydia, he request empiric treatment but does not want doxycycline as this makes him sick, we can give him a gram azithromycin as an alternative.  Okay for discharge ? ? ? ? ? ? ? ?Final Clinical Impression(s) / ED Diagnoses ?Final diagnoses:  ?Penile discharge  ? ? ?Rx / DC Orders ?ED Discharge Orders   ? ? None  ? ?  ? ? ?  ?Gwynn Chalker,  Kermit Balo, MD ?06/01/21 1524 ? ?

## 2021-06-06 LAB — GC/CHLAMYDIA PROBE AMP (~~LOC~~) NOT AT ARMC
Chlamydia: NEGATIVE
Comment: NEGATIVE
Comment: NORMAL
Neisseria Gonorrhea: NEGATIVE

## 2021-08-31 ENCOUNTER — Emergency Department (HOSPITAL_BASED_OUTPATIENT_CLINIC_OR_DEPARTMENT_OTHER)
Admission: EM | Admit: 2021-08-31 | Discharge: 2021-08-31 | Disposition: A | Discharge status: Home / Self Care | Payer: Medicaid Other | Attending: Emergency Medicine | Admitting: Emergency Medicine

## 2021-08-31 ENCOUNTER — Encounter (HOSPITAL_BASED_OUTPATIENT_CLINIC_OR_DEPARTMENT_OTHER): Payer: Self-pay | Admitting: Emergency Medicine

## 2021-08-31 ENCOUNTER — Other Ambulatory Visit: Payer: Self-pay

## 2021-08-31 DIAGNOSIS — R369 Urethral discharge, unspecified: Secondary | ICD-10-CM | POA: Insufficient documentation

## 2021-08-31 LAB — URINALYSIS, ROUTINE W REFLEX MICROSCOPIC
Bilirubin Urine: NEGATIVE
Glucose, UA: NEGATIVE mg/dL
Hgb urine dipstick: NEGATIVE
Ketones, ur: NEGATIVE mg/dL
Leukocytes,Ua: NEGATIVE
Nitrite: NEGATIVE
Specific Gravity, Urine: 1.024 (ref 1.005–1.030)
pH: 7 (ref 5.0–8.0)

## 2021-08-31 LAB — HIV ANTIBODY (ROUTINE TESTING W REFLEX): HIV Screen 4th Generation wRfx: NONREACTIVE

## 2021-08-31 MED ORDER — LIDOCAINE HCL (PF) 1 % IJ SOLN
1.0000 mL | Freq: Once | INTRAMUSCULAR | Status: AC
  Administered 2021-08-31: 2.1 mL
  Filled 2021-08-31: qty 5

## 2021-08-31 MED ORDER — AZITHROMYCIN 250 MG PO TABS
1000.0000 mg | ORAL_TABLET | Freq: Once | ORAL | Status: AC
  Administered 2021-08-31: 1000 mg via ORAL
  Filled 2021-08-31: qty 4

## 2021-08-31 MED ORDER — CEFTRIAXONE SODIUM 500 MG IJ SOLR
500.0000 mg | Freq: Once | INTRAMUSCULAR | Status: AC
  Administered 2021-08-31: 500 mg via INTRAMUSCULAR
  Filled 2021-08-31: qty 500

## 2021-08-31 NOTE — ED Provider Notes (Signed)
[facility] Provider Note   CSN: [id number] Arrival date & time: 08/31/21  1435     History  Chief Complaint  Patient presents with   Penile Discharge    [name] is a 26 y.o. male.   Penile Discharge    Patient presents to the ED for evaluation of penile discharge.  Patient states he noticed the discharge a couple days ago.  He has had some burning when he urinates.  He denies any rashes or lesions.  He denies any abdominal pain fevers or chills.  Patient denies any new sexual partners.  Home Medications Prior to Admission medications   Not on File      Allergies    Cephalexin, Flexeril [cyclobenzaprine], and Ibuprofen    Review of Systems   Review of Systems  Genitourinary:  Positive for penile discharge.    Physical Exam Updated Vital Signs BP 115/83 (BP Location: Right Arm)   Pulse (!) 59   Temp 98.6 F (37 C)   Resp 16   SpO2 100%  Physical Exam Vitals and nursing note reviewed.  Constitutional:      General: He is not in acute distress.    Appearance: He is well-developed.  HENT:     Head: Normocephalic and atraumatic.     Right Ear: External ear normal.     Left Ear: External ear normal.  Eyes:     General: No scleral icterus.       Right eye: No discharge.        Left eye: No discharge.     Conjunctiva/sclera: Conjunctivae normal.  Neck:     Trachea: No tracheal deviation.  Cardiovascular:     Rate and Rhythm: Normal rate.  Pulmonary:     Effort: Pulmonary effort is normal. No respiratory distress.     Breath sounds: No stridor.  Abdominal:     General: There is no distension.  Genitourinary:    Penis: Normal.      Testes: Normal.  Musculoskeletal:        General: No swelling or deformity.     Cervical back: Neck supple.  Skin:    General: Skin is warm and dry.     Findings: No rash.  Neurological:     Mental Status: He is alert.     Cranial Nerves: Cranial nerve deficit: no gross deficits.      ED Results / Procedures / Treatments   Labs (all labs ordered are listed, but only abnormal results are displayed) Labs Reviewed  URINALYSIS, ROUTINE W REFLEX MICROSCOPIC - Abnormal; Notable for the following components:      Result Value   Protein, ur TRACE (*)    All other components within normal limits  RPR  HIV ANTIBODY (ROUTINE TESTING W REFLEX)  GC/CHLAMYDIA PROBE AMP ([place]) NOT AT [facility]    EKG None  Radiology No results found.  Procedures Procedures    Medications Ordered in ED Medications  lidocaine (PF) (XYLOCAINE) 1 % injection 1-2.1 mL (has no administration in time range)  cefTRIAXone (ROCEPHIN) injection 500 mg (500 mg Intramuscular Given 08/31/21 1839)  azithromycin (ZITHROMAX) tablet 1,000 mg (1,000 mg Oral Given 08/31/21 1838)    ED Course/ Medical Decision Making/ A&P Clinical Course as of 08/31/21 1845  Thu Aug 31, 2021  1758 Pt refuses doxy rx.  States he has an intolerance.  Discussed antinausea meds, taking with food. Refuses still but does agree to [employer]  [JK]    Clinical  Course User Index [JK] Linwood Dibbles, MD                           Medical Decision Making Amount and/or Complexity of Data Reviewed Labs: ordered.  Risk Prescription drug management.   Urinalysis is unremarkable.  Patient's symptoms are concerning for possible sexually transmitted infection.  Will treat empirically with Rocephin.  Discussed doxycycline however patient states he has an intolerance and will not take that medication.  We will treat with Rocephin and azithromycin.        Final Clinical Impression(s) / ED Diagnoses Final diagnoses:  Penile discharge    Rx / DC Orders ED Discharge Orders     None         Linwood Dibbles, MD 08/31/21 1845

## 2021-08-31 NOTE — ED Triage Notes (Signed)
Pt here from home with c/o penile discharge over the last 2 days , some burning when he urinates

## 2021-08-31 NOTE — Discharge Instructions (Signed)
Your test results should be available within the next week.  Check your MyChart for the results.  Avoid any intercourse for the next week.  Discussed with your partners about possible testing

## 2021-09-01 LAB — RPR: RPR Ser Ql: NONREACTIVE

## 2021-09-01 LAB — GC/CHLAMYDIA PROBE AMP (~~LOC~~) NOT AT ARMC
Chlamydia: NEGATIVE
Comment: NEGATIVE
Comment: NORMAL
Neisseria Gonorrhea: NEGATIVE

## 2022-01-12 ENCOUNTER — Encounter (HOSPITAL_BASED_OUTPATIENT_CLINIC_OR_DEPARTMENT_OTHER): Payer: Self-pay | Admitting: Emergency Medicine

## 2022-01-12 ENCOUNTER — Other Ambulatory Visit: Payer: Self-pay

## 2022-01-12 ENCOUNTER — Emergency Department (HOSPITAL_BASED_OUTPATIENT_CLINIC_OR_DEPARTMENT_OTHER)
Admission: EM | Admit: 2022-01-12 | Discharge: 2022-01-12 | Disposition: A | Discharge status: Home / Self Care | Payer: Medicaid Other | Attending: Emergency Medicine | Admitting: Emergency Medicine

## 2022-01-12 DIAGNOSIS — Z202 Contact with and (suspected) exposure to infections with a predominantly sexual mode of transmission: Secondary | ICD-10-CM | POA: Insufficient documentation

## 2022-01-12 LAB — RAPID HIV SCREEN (HIV 1/2 AB+AG)
HIV 1/2 Antibodies: NONREACTIVE
HIV-1 P24 Antigen - HIV24: NONREACTIVE

## 2022-01-12 NOTE — Discharge Instructions (Addendum)
You have been screened for gonorrhea, chlamydia HIV and syphilis in the emergency room.  Please follow-up on MyChart with results.

## 2022-01-12 NOTE — ED Triage Notes (Signed)
Arrives POV ambulatory Aox4. Wants STD check-no symptoms-found out partner has had multiple other partners.

## 2022-01-12 NOTE — ED Notes (Signed)
Pt verbalized understanding of d/c instructions, meds, and followup care. Denies questions. VSS, no distress noted. Steady gait to exit with all belongings.  ?

## 2022-01-12 NOTE — ED Provider Notes (Signed)
  [facility] EM[facility] DEPT Provider Note   CSN: [id number] Arrival date & time: 01/12/22  1110     History  Chief Complaint  Patient presents with   SEXUALLY TRANSMITTED DISEASE    TARIUS STANGELO is a 26 y.o. male.  HPI     26 y/o M comes in for STI evaluation.  Patient states that he has been having intercourse with his girlfriend, the girlfriend states that she has been sleeping with multiple people.  He is not having any burning with urination, penile discharge or any rash.  He wants to get STD evaluation.  Home Medications Prior to Admission medications   Not on File      Allergies    Cephalexin, Flexeril [cyclobenzaprine], and Ibuprofen    Review of Systems   Review of Systems  Physical Exam Updated Vital Signs BP 123/73   Pulse 60   Temp 98.1 F (36.7 C) (Oral)   Resp 16   Ht 5\' 10"  (1.778 m)   Wt 72.6 kg   SpO2 100%   BMI 22.96 kg/m  Physical Exam Vitals and nursing note reviewed.  Constitutional:      Appearance: He is well-developed.  HENT:     Head: Atraumatic.  Cardiovascular:     Rate and Rhythm: Normal rate.  Pulmonary:     Effort: Pulmonary effort is normal.  Musculoskeletal:     Cervical back: Neck supple.  Skin:    General: Skin is warm.  Neurological:     Mental Status: He is alert and oriented to person, place, and time.     ED Results / Procedures / Treatments   Labs (all labs ordered are listed, but only abnormal results are displayed) Labs Reviewed  RAPID HIV SCREEN (HIV 1/2 AB+AG)  RPR  GC/CHLAMYDIA PROBE AMP ([place]) NOT AT [facility]    EKG None  Radiology No results found.  Procedures Procedures    Medications Ordered in ED Medications - No data to display  ED Course/ Medical Decision Making/ A&P                           Medical Decision Making 26 year old patient comes in with chief complaint of STD evaluation.  He is totally asymptomatic, but states that his partner has had multiple  partners recently and wants to make sure he does not have any STI.  Urine GC, chlamydia ordered.  Patient is consented for HIV and RPR.  Amount and/or Complexity of Data Reviewed Labs: ordered.   Final Clinical Impression(s) / ED Diagnoses Final diagnoses:  Possible exposure to STD    Rx / DC Orders ED Discharge Orders     None         27, MD 01/12/22 1500

## 2022-01-13 LAB — RPR: RPR Ser Ql: NONREACTIVE

## 2022-01-15 LAB — GC/CHLAMYDIA PROBE AMP (~~LOC~~) NOT AT ARMC
Chlamydia: NEGATIVE
Comment: NEGATIVE
Comment: NORMAL
Neisseria Gonorrhea: NEGATIVE

## 2022-03-06 ENCOUNTER — Encounter (HOSPITAL_BASED_OUTPATIENT_CLINIC_OR_DEPARTMENT_OTHER): Payer: Self-pay | Admitting: Emergency Medicine

## 2022-03-06 ENCOUNTER — Emergency Department (HOSPITAL_BASED_OUTPATIENT_CLINIC_OR_DEPARTMENT_OTHER)
Admission: EM | Admit: 2022-03-06 | Discharge: 2022-03-06 | Disposition: A | Discharge status: Home / Self Care | Payer: Medicaid Other | Attending: Emergency Medicine | Admitting: Emergency Medicine

## 2022-03-06 ENCOUNTER — Other Ambulatory Visit: Payer: Self-pay

## 2022-03-06 DIAGNOSIS — R369 Urethral discharge, unspecified: Secondary | ICD-10-CM | POA: Insufficient documentation

## 2022-03-06 LAB — URINALYSIS, ROUTINE W REFLEX MICROSCOPIC
Bilirubin Urine: NEGATIVE
Glucose, UA: NEGATIVE mg/dL
Hgb urine dipstick: NEGATIVE
Ketones, ur: NEGATIVE mg/dL
Leukocytes,Ua: NEGATIVE
Nitrite: NEGATIVE
Protein, ur: NEGATIVE mg/dL
Specific Gravity, Urine: 1.02 (ref 1.005–1.030)
pH: 7.5 (ref 5.0–8.0)

## 2022-03-06 LAB — URINALYSIS, W/ REFLEX TO CULTURE (INFECTION SUSPECTED)
Bacteria, UA: NONE SEEN
Bilirubin Urine: NEGATIVE
Glucose, UA: NEGATIVE mg/dL
Hgb urine dipstick: NEGATIVE
Ketones, ur: NEGATIVE mg/dL
Leukocytes,Ua: NEGATIVE
Nitrite: NEGATIVE
Protein, ur: NEGATIVE mg/dL
Specific Gravity, Urine: 1.02 (ref 1.005–1.030)
pH: 7.5 (ref 5.0–8.0)

## 2022-03-06 MED ORDER — DOXYCYCLINE HYCLATE 100 MG PO CAPS: 100.0000 mg | ORAL_CAPSULE | Freq: Two times a day (BID) | ORAL | 0 refills | Status: DC

## 2022-03-06 NOTE — Discharge Instructions (Signed)
Follow-up gonorrhea chlamydia and culture results on MyChart and with your primary doctor in 2 to 3 days. If gonorrhea is positive you will need to return to an urgent care, [place] or emergency department for antibiotic intramuscular shot. If chlamydia is positive I provided doxycycline for you for 1 week. Return for new concerns. Tylenol as needed every 4 hours for pain. No sexual activity for at least 1 week and until results.

## 2022-03-06 NOTE — ED Triage Notes (Signed)
Pt arrives to ED with c/o penile discharge x3 days. He notes mild right sided flank pain.

## 2022-03-06 NOTE — ED Provider Notes (Signed)
[facility] Provider Note   CSN: [id number] Arrival date & time: 03/06/22  1017     History  Chief Complaint  Patient presents with   Penile Discharge    Todd Mcguire is a 27 y.o. male.  Patient presents with intermittent penile discharge for 3 days.  Patient sexually active with 1 partner last 2 to 3 days ago.  Patient has mild right flank pain intermittent.  No fevers chills or vomiting.  No history of significant infections or STDs.  Patient's partner was tested for STDs recently and per report was negative.  Patient has mild sensitivity to oral antibiotic no true allergic reaction known per his report.  Patient partners male.       Home Medications Prior to Admission medications   Medication Sig Start Date End Date Taking? Authorizing Provider  doxycycline (VIBRAMYCIN) 100 MG capsule Take 1 capsule (100 mg total) by mouth 2 (two) times daily. One po bid x 7 days 03/06/22  Yes Elnora Morrison, MD      Allergies    Cephalexin, Flexeril [cyclobenzaprine], and Ibuprofen    Review of Systems   Review of Systems  Constitutional:  Negative for chills and fever.  HENT:  Negative for congestion.   Eyes:  Negative for visual disturbance.  Respiratory:  Negative for shortness of breath.   Cardiovascular:  Negative for chest pain.  Gastrointestinal:  Negative for abdominal pain and vomiting.  Genitourinary:  Positive for penile discharge. Negative for dysuria and flank pain.  Musculoskeletal:  Negative for back pain, neck pain and neck stiffness.  Skin:  Negative for rash.  Neurological:  Negative for light-headedness and headaches.    Physical Exam Updated Vital Signs BP 133/79 (BP Location: Right Arm)   Pulse 73   Temp 98.9 F (37.2 C)   Resp 14   Ht 5\' 10"  (1.778 m)   Wt 72.6 kg   SpO2 100%   BMI 22.96 kg/m  Physical Exam Vitals and nursing note reviewed.  Constitutional:      General: He is not in acute  distress.    Appearance: He is well-developed.  HENT:     Head: Normocephalic and atraumatic.     Mouth/Throat:     Mouth: Mucous membranes are moist.  Eyes:     General:        Right eye: No discharge.        Left eye: No discharge.     Conjunctiva/sclera: Conjunctivae normal.  Neck:     Trachea: No tracheal deviation.  Cardiovascular:     Rate and Rhythm: Normal rate.  Pulmonary:     Effort: Pulmonary effort is normal.  Abdominal:     General: There is no distension.     Palpations: Abdomen is soft.     Tenderness: There is no abdominal tenderness. There is no guarding.  Genitourinary:    Comments: No lesions or rash, no penile discharge.  No inguinal swelling. Musculoskeletal:     Cervical back: Normal range of motion.  Skin:    General: Skin is warm.     Capillary Refill: Capillary refill takes less than 2 seconds.     Findings: No rash.  Neurological:     General: No focal deficit present.     Mental Status: He is alert.     Cranial Nerves: No cranial nerve deficit.  Psychiatric:        Mood and Affect: Mood normal.     ED  Results / Procedures / Treatments   Labs (all labs ordered are listed, but only abnormal results are displayed) Labs Reviewed  URINALYSIS, ROUTINE W REFLEX MICROSCOPIC  URINALYSIS, W/ REFLEX TO CULTURE (INFECTION SUSPECTED)  GC/CHLAMYDIA PROBE AMP ([place]) NOT AT [facility]    EKG None  Radiology No results found.  Procedures Procedures    Medications Ordered in ED Medications - No data to display  ED Course/ Medical Decision Making/ A&P                             Medical Decision Making Amount and/or Complexity of Data Reviewed Labs: ordered.  Risk Prescription drug management.   Patient presents with concern of mild penile discharge.  Patient says he is not concerned for STD 1 partner and that partner had negative testing recently.  Discussed this may still be a sexually transmitted infection.  Patient has more  sensitivity to cephalosporins.  Shared decision making decision made to follow-up GC results to complete treatment if needed.  Plan for doxycycline and outpatient follow-up with [facility] or to return.  Patient comfortable this plan.  No signs of pyelonephritis on exam or abdominal pathology.  Patient stable for discharge.        Final Clinical Impression(s) / ED Diagnoses Final diagnoses:  Penile discharge    Rx / DC Orders ED Discharge Orders          Ordered    doxycycline (VIBRAMYCIN) 100 MG capsule  2 times daily        03/06/22 1235              Elnora Morrison, MD 03/06/22 1243

## 2022-03-07 LAB — GC/CHLAMYDIA PROBE AMP (~~LOC~~) NOT AT ARMC
Chlamydia: NEGATIVE
Comment: NEGATIVE
Comment: NORMAL
Neisseria Gonorrhea: NEGATIVE

## 2022-03-14 ENCOUNTER — Emergency Department (HOSPITAL_BASED_OUTPATIENT_CLINIC_OR_DEPARTMENT_OTHER): Payer: Medicaid Other

## 2022-03-14 ENCOUNTER — Encounter (HOSPITAL_BASED_OUTPATIENT_CLINIC_OR_DEPARTMENT_OTHER): Payer: Self-pay | Admitting: Emergency Medicine

## 2022-03-14 ENCOUNTER — Other Ambulatory Visit: Payer: Self-pay

## 2022-03-14 ENCOUNTER — Emergency Department (HOSPITAL_BASED_OUTPATIENT_CLINIC_OR_DEPARTMENT_OTHER)
Admission: EM | Admit: 2022-03-14 | Discharge: 2022-03-14 | Disposition: A | Discharge status: Home / Self Care | Payer: Medicaid Other | Attending: Emergency Medicine | Admitting: Emergency Medicine

## 2022-03-14 DIAGNOSIS — R369 Urethral discharge, unspecified: Secondary | ICD-10-CM | POA: Insufficient documentation

## 2022-03-14 DIAGNOSIS — R360 Urethral discharge without blood: Secondary | ICD-10-CM

## 2022-03-14 LAB — URINALYSIS, W/ REFLEX TO CULTURE (INFECTION SUSPECTED)
Bacteria, UA: NONE SEEN
Bilirubin Urine: NEGATIVE
Glucose, UA: NEGATIVE mg/dL
Hgb urine dipstick: NEGATIVE
Ketones, ur: NEGATIVE mg/dL
Leukocytes,Ua: NEGATIVE
Nitrite: NEGATIVE
Protein, ur: 30 mg/dL — AB
Specific Gravity, Urine: 1.035 — ABNORMAL HIGH (ref 1.005–1.030)
pH: 7 (ref 5.0–8.0)

## 2022-03-14 MED ORDER — CEFTRIAXONE SODIUM 500 MG IJ SOLR
500.0000 mg | Freq: Once | INTRAMUSCULAR | Status: AC
  Administered 2022-03-14: 500 mg via INTRAMUSCULAR
  Filled 2022-03-14: qty 500

## 2022-03-14 MED ORDER — ONDANSETRON HCL 4 MG PO TABS: 4.0000 mg | ORAL_TABLET | Freq: Four times a day (QID) | ORAL | 0 refills | Status: DC

## 2022-03-14 MED ORDER — METRONIDAZOLE 500 MG PO TABS: 500.0000 mg | ORAL_TABLET | Freq: Two times a day (BID) | ORAL | 0 refills | Status: DC

## 2022-03-14 MED ORDER — AZITHROMYCIN 250 MG PO TABS
1000.0000 mg | ORAL_TABLET | Freq: Once | ORAL | Status: AC
  Administered 2022-03-14: 1000 mg via ORAL
  Filled 2022-03-14: qty 4

## 2022-03-14 MED ORDER — STERILE WATER FOR INJECTION IJ SOLN
INTRAMUSCULAR | Status: AC
  Filled 2022-03-14: qty 10

## 2022-03-14 NOTE — ED Triage Notes (Signed)
Seen for same on 03/06/22 Given doxy  Took some only once a day and stopped early.  Still having discharge but has improved some

## 2022-03-14 NOTE — Discharge Instructions (Signed)
The scan today was normal and no signs of infection in your urine.  You were treated for gonorrhea and chlamydia just in case.  The Flagyl is to cover for anything like trichomonas and you can use the Zofran with that as needed.  If your symptoms do not completely resolve follow-up with the health department.  Avoid sex for the next 2 weeks.

## 2022-03-14 NOTE — ED Notes (Signed)
Discharge paperwork given and verbally understood. 

## 2022-03-14 NOTE — ED Provider Notes (Signed)
[facility] Provider Note   CSN: [id number] Arrival date & time: 03/14/22  1502     History  Chief Complaint  Patient presents with   Penile Discharge    Todd Mcguire is a 27 y.o. male.  Patient is a 27 year old male presenting today with ongoing discharge from his penis.  Patient reports he initially noticed it on 03/06/2022.  He reports that he noticed it shortly after having unprotected sex with his partner who had been positive for BV.  He came to the emergency room and was evaluated and at that time was given doxycycline.  He reports his GC chlamydia test came back negative but he started the doxycycline anyway because he was still having discharge.  He took 3 total doses of doxycycline but then he reports it started making him feel nauseated and he discontinued it.  He noticed the discharge did improve but then it started coming back a little bit.  He has also had some mild pain in his right flank.  He has no abdominal pain or fever.  Denies any irritating substances that would cause irritation.  The history is provided by the patient.  Penile Discharge       Home Medications Prior to Admission medications   Medication Sig Start Date End Date Taking? Authorizing Provider  metroNIDAZOLE (FLAGYL) 500 MG tablet Take 1 tablet (500 mg total) by mouth 2 (two) times daily. 03/14/22  Yes Tianne Plott, Loree Fee, MD  ondansetron (ZOFRAN) 4 MG tablet Take 1 tablet (4 mg total) by mouth every 6 (six) hours. 03/14/22  Yes Blanchie Dessert, MD  doxycycline (VIBRAMYCIN) 100 MG capsule Take 1 capsule (100 mg total) by mouth 2 (two) times daily. One po bid x 7 days 03/06/22   Elnora Morrison, MD      Allergies    Cephalexin, Doxycycline, Flexeril [cyclobenzaprine], and Ibuprofen    Review of Systems   Review of Systems  Genitourinary:  Positive for penile discharge.    Physical Exam Updated Vital Signs BP 113/77 (BP Location: Right Arm)   Pulse (!)  57   Temp 98.4 F (36.9 C) (Oral)   Resp 16   SpO2 100%  Physical Exam HENT:     Head: Normocephalic.  Cardiovascular:     Rate and Rhythm: Normal rate.  Abdominal:     General: Abdomen is flat.     Palpations: Abdomen is soft.     Tenderness: There is no abdominal tenderness. There is right CVA tenderness.  Genitourinary:    Penis: Normal.      Testes: Normal.  Neurological:     Mental Status: He is alert. Mental status is at baseline.     ED Results / Procedures / Treatments   Labs (all labs ordered are listed, but only abnormal results are displayed) Labs Reviewed  URINALYSIS, W/ REFLEX TO CULTURE (INFECTION SUSPECTED) - Abnormal; Notable for the following components:      Result Value   Specific Gravity, Urine 1.035 (*)    Protein, ur 30 (*)    All other components within normal limits    EKG None  Radiology CT Renal Stone Study  Result Date: 03/14/2022 CLINICAL DATA:  Abdominal and flank pain over the last month EXAM: CT ABDOMEN AND PELVIS WITHOUT CONTRAST TECHNIQUE: Multidetector CT imaging of the abdomen and pelvis was performed following the standard protocol without IV contrast. RADIATION DOSE REDUCTION: This exam was performed according to the departmental dose-optimization program which includes automated  exposure control, adjustment of the mA and/or kV according to patient size and/or use of iterative reconstruction technique. COMPARISON:  Abdomen radiograph 10/12/2015 FINDINGS: Lower chest: Unremarkable Hepatobiliary: Unremarkable Pancreas: Unremarkable Spleen: Unremarkable Adrenals/Urinary Tract: Unremarkable. No urinary tract calculi or hydronephrosis. Stomach/Bowel: Upper normal appendiceal diameter at 0.7 cm, also with gas in segments of the appendix and no substantial periappendiceal inflammatory stranding, overall considered within normal limits. Vascular/Lymphatic: Unremarkable Reproductive: Unremarkable Other: No supplemental non-categorized findings.  Musculoskeletal: Unremarkable IMPRESSION: 1. A specific cause for the patient's abdominal pain is not identified. No urinary tract calculi or hydronephrosis. Electronically Signed   By: Van Clines M.D.   On: 03/14/2022 17:30    Procedures Procedures    Medications Ordered in ED Medications  cefTRIAXone (ROCEPHIN) injection 500 mg (has no administration in time range)  azithromycin (ZITHROMAX) tablet 1,000 mg (has no administration in time range)    ED Course/ Medical Decision Making/ A&P                             Medical Decision Making Amount and/or Complexity of Data Reviewed Radiology: ordered and independent interpretation performed. Decision-making details documented in ED Course.  Risk Prescription drug management.   Patient returning due to persistent penile discharge despite negative workup 1 week ago.  Patient's partner was positive for BV but was negative for everything else.  He was negative for everything a week ago.  Patient is complaining of some mild right flank pain as well.  He has no prior history of renal stones.  I independently interpreted patient's UA today which showed protein but no other acute findings except for some mucus.  His symptoms did start to improve with doxycycline but he reports he could not tolerate it because it made him nauseated.  He wants to be treated for gonorrhea and chlamydia just in case.  We could use Rocephin and one-time dose of azithromycin.  However also may need to start the patient on Flagyl.  Also given his flank pain and the discomfort concern for a stone or some other cause for his discomfort.  5:49 PM Renal stone study is negative for hydronephrosis or stones.  Findings discussed with the patient.  At this time he was covered for an STI and sent home with Flagyl.  Given information for the health department to follow-up if symptoms do not resolve or return.        Final Clinical Impression(s) / ED Diagnoses Final  diagnoses:  Penile discharge, without blood    Rx / DC Orders ED Discharge Orders          Ordered    metroNIDAZOLE (FLAGYL) 500 MG tablet  2 times daily        03/14/22 1748    ondansetron (ZOFRAN) 4 MG tablet  Every 6 hours        03/14/22 1748              Blanchie Dessert, MD 03/14/22 1750

## 2022-03-26 ENCOUNTER — Ambulatory Visit
Admission: RE | Admit: 2022-03-26 | Discharge: 2022-03-26 | Disposition: A | Discharge status: Home / Self Care | Payer: Medicaid Other | Source: Ambulatory Visit | Attending: Physician Assistant | Admitting: Physician Assistant

## 2022-03-26 VITALS — BP 121/77 | HR 61 | Temp 98.0°F | Resp 20

## 2022-03-26 DIAGNOSIS — R369 Urethral discharge, unspecified: Secondary | ICD-10-CM | POA: Insufficient documentation

## 2022-03-26 DIAGNOSIS — R300 Dysuria: Secondary | ICD-10-CM

## 2022-03-26 LAB — POCT URINALYSIS DIP (MANUAL ENTRY)
Bilirubin, UA: NEGATIVE
Blood, UA: NEGATIVE
Glucose, UA: NEGATIVE mg/dL
Ketones, POC UA: NEGATIVE mg/dL
Leukocytes, UA: NEGATIVE
Nitrite, UA: NEGATIVE
Spec Grav, UA: 1.02 (ref 1.010–1.025)
Urobilinogen, UA: 0.2 E.U./dL
pH, UA: 7.5 (ref 5.0–8.0)

## 2022-03-26 NOTE — ED Provider Notes (Signed)
[facility]    CSN: [id number] Arrival date & time: 03/26/22  1118      History   Chief Complaint Chief Complaint  Patient presents with   Urinary Frequency   Abdominal Pain   Dysuria    HPI SHAINA BEAUDET is a 27 y.o. male.   Patient here today for evaluation of urinary frequency, dysuria and penile discharge in the mornings that has occurred for 1-2 weeks. He has not had any fever. He denies any genital lesions or rashes. He has been seen at [facility] and was negative for GC/Chlamydia but treated for trichomonas without resolution of symptoms.   The history is provided by the patient.  Urinary Frequency  Abdominal Pain Associated symptoms: dysuria   Associated symptoms: no chills and no fever   Dysuria Presenting symptoms: dysuria and penile discharge   Associated symptoms: no fever     Past Medical History:  Diagnosis Date   Dizziness 05/22/2017   Heart murmur    Palpitations 05/22/2017    Patient Active Problem List   Diagnosis Date Noted   Palpitations 05/22/2017   Dizziness 05/22/2017   Heart murmur     History reviewed. No pertinent surgical history.     Home Medications    Prior to Admission medications   Medication Sig Start Date End Date Taking? Authorizing Provider  doxycycline (VIBRAMYCIN) 100 MG capsule Take 1 capsule (100 mg total) by mouth 2 (two) times daily. One po bid x 7 days 03/06/22   Elnora Morrison, MD  metroNIDAZOLE (FLAGYL) 500 MG tablet Take 1 tablet (500 mg total) by mouth 2 (two) times daily. 03/14/22   Blanchie Dessert, MD  ondansetron (ZOFRAN) 4 MG tablet Take 1 tablet (4 mg total) by mouth every 6 (six) hours. 03/14/22   Blanchie Dessert, MD    Family History Family History  Problem Relation Age of Onset   Healthy Mother    Healthy Father     Social History Social History   Tobacco Use   Smoking status: Some Days    Types: Cigars   Smokeless tobacco: Never  Vaping Use   Vaping Use: Never used   Substance Use Topics   Alcohol use: Yes    Comment: occasionally   Drug use: Not Currently    Types: Marijuana     Allergies   Cephalexin, Doxycycline, Flexeril [cyclobenzaprine], and Ibuprofen   Review of Systems Review of Systems  Constitutional:  Negative for chills and fever.  Eyes:  Negative for discharge and redness.  Genitourinary:  Positive for dysuria and penile discharge. Negative for genital sores.  Neurological:  Negative for numbness.     Physical Exam Triage Vital Signs ED Triage Vitals  Enc Vitals Group     BP 03/26/22 1130 121/77     Pulse Rate 03/26/22 1130 61     Resp 03/26/22 1130 20     Temp 03/26/22 1130 98 F (36.7 C)     Temp Source 03/26/22 1130 Oral     SpO2 03/26/22 1130 98 %     Weight --      Height --      Head Circumference --      Peak Flow --      Pain Score 03/26/22 1142 2     Pain Loc --      Pain Edu? --      Excl. in [place]? --    No data found.  Updated Vital Signs BP 121/77 (BP Location: Left Arm)  Pulse 61   Temp 98 F (36.7 C) (Oral)   Resp 20   SpO2 98%      Physical Exam Vitals and nursing note reviewed.  Constitutional:      General: He is not in acute distress.    Appearance: Normal appearance. He is not ill-appearing.  HENT:     Head: Normocephalic and atraumatic.  Eyes:     Conjunctiva/sclera: Conjunctivae normal.  Cardiovascular:     Rate and Rhythm: Normal rate.  Pulmonary:     Effort: Pulmonary effort is normal. No respiratory distress.  Neurological:     Mental Status: He is alert.  Psychiatric:        Mood and Affect: Mood normal.        Behavior: Behavior normal.        Thought Content: Thought content normal.      UC Treatments / Results  Labs (all labs ordered are listed, but only abnormal results are displayed) Labs Reviewed  POCT URINALYSIS DIP (MANUAL ENTRY) - Abnormal; Notable for the following components:      Result Value   Protein Ur, POC trace (*)    All other components  within normal limits  CYTOLOGY, (ORAL, ANAL, URETHRAL) ANCILLARY ONLY    EKG   Radiology No results found.  Procedures Procedures (including critical care time)  Medications Ordered in UC Medications - No data to display  Initial Impression / Assessment and Plan / UC Course  I have reviewed the triage vital signs and the nursing notes.  Pertinent labs & imaging results that were available during my care of the patient were reviewed by me and considered in my medical decision making (see chart for details).    UA without concerning findings. Will order repeat STD screening and will await results for further recommendation. Encouraged follow up with any further concerns.   Final Clinical Impressions(s) / UC Diagnoses   Final diagnoses:  Penile discharge  Dysuria   Discharge Instructions   None    ED Prescriptions   None    PDMP not reviewed this encounter.   Francene Finders, PA-C 03/26/22 1515

## 2022-03-26 NOTE — ED Triage Notes (Signed)
Pt presents with right side pain, urinary frequency, dysuria and Discharge in am xs 1-2 weeks. States was seen and treated at [facility] but symptoms have not resolved.

## 2022-03-27 LAB — CYTOLOGY, (ORAL, ANAL, URETHRAL) ANCILLARY ONLY
Chlamydia: NEGATIVE
Comment: NEGATIVE
Comment: NEGATIVE
Comment: NORMAL
Neisseria Gonorrhea: NEGATIVE
Trichomonas: NEGATIVE

## 2022-03-30 ENCOUNTER — Other Ambulatory Visit: Payer: Self-pay

## 2022-03-30 ENCOUNTER — Encounter (HOSPITAL_BASED_OUTPATIENT_CLINIC_OR_DEPARTMENT_OTHER): Payer: Self-pay | Admitting: Emergency Medicine

## 2022-03-30 ENCOUNTER — Emergency Department (HOSPITAL_BASED_OUTPATIENT_CLINIC_OR_DEPARTMENT_OTHER)
Admission: EM | Admit: 2022-03-30 | Discharge: 2022-03-30 | Disposition: A | Discharge status: Home / Self Care | Payer: Managed Care, Other (non HMO) | Attending: Emergency Medicine | Admitting: Emergency Medicine

## 2022-03-30 DIAGNOSIS — R369 Urethral discharge, unspecified: Secondary | ICD-10-CM | POA: Insufficient documentation

## 2022-03-30 LAB — URINALYSIS, ROUTINE W REFLEX MICROSCOPIC
Bacteria, UA: NONE SEEN
Bilirubin Urine: NEGATIVE
Glucose, UA: NEGATIVE mg/dL
Hgb urine dipstick: NEGATIVE
Ketones, ur: NEGATIVE mg/dL
Leukocytes,Ua: NEGATIVE
Nitrite: NEGATIVE
Protein, ur: 30 mg/dL — AB
Specific Gravity, Urine: 1.034 — ABNORMAL HIGH (ref 1.005–1.030)
pH: 6.5 (ref 5.0–8.0)

## 2022-03-30 LAB — HIV ANTIBODY (ROUTINE TESTING W REFLEX): HIV Screen 4th Generation wRfx: NONREACTIVE

## 2022-03-30 MED ORDER — METRONIDAZOLE 500 MG PO TABS
2000.0000 mg | ORAL_TABLET | Freq: Once | ORAL | Status: AC
  Administered 2022-03-30: 2000 mg via ORAL
  Filled 2022-03-30: qty 4

## 2022-03-30 NOTE — ED Triage Notes (Signed)
Pt arrive sto ED with c/o on-going penile discharge.

## 2022-03-30 NOTE — Discharge Instructions (Addendum)
You were seen today for penile discharge.  You were treated with a single dose of which should hopefully clear up an underlying STD.  If you continue to have symptoms you need to follow-up with a urologist, call today to schedule outpatient follow-up in the next week or so with Dr. Cain Sieve office.  You can go to the health department in future for STD testing.  Return to the ED for emergent conditions or symptoms.

## 2022-03-30 NOTE — ED Provider Notes (Signed)
[facility] Provider Note   CSN: [id number] Arrival date & time: 03/30/22  [id number]     History  Chief Complaint  Patient presents with   Penile Discharge    Todd Mcguire is a 27 y.o. male.   Penile Discharge     This is a 27 year old male presenting to the emergency department due to penile discharge x 2 weeks.  He has been seen in the ED twice prior to this and tested negative for gonorrhea, chlamydia and trichomoniasis.  He has been treated with Rocephin, azithromycin and doxycycline with some improvement as he is now only having discharge in the morning and on the evenings but it has been persistent.  Describes it is pruritic, he has not noticed any rashes, testicular pain, abdominal pain, flank pain, hematuria, dysuria.  Last sexually active 2 months ago with male partner who had BV and yeast infection.  Home Medications Prior to Admission medications   Medication Sig Start Date End Date Taking? Authorizing Provider  doxycycline (VIBRAMYCIN) 100 MG capsule Take 1 capsule (100 mg total) by mouth 2 (two) times daily. One po bid x 7 days 03/06/22   Elnora Morrison, MD  metroNIDAZOLE (FLAGYL) 500 MG tablet Take 1 tablet (500 mg total) by mouth 2 (two) times daily. 03/14/22   Blanchie Dessert, MD  ondansetron (ZOFRAN) 4 MG tablet Take 1 tablet (4 mg total) by mouth every 6 (six) hours. 03/14/22   Blanchie Dessert, MD      Allergies    Cephalexin, Doxycycline, Flexeril [cyclobenzaprine], and Ibuprofen    Review of Systems   Review of Systems  Genitourinary:  Positive for penile discharge.    Physical Exam Updated Vital Signs BP (!) 137/96 (BP Location: Right Arm)   Pulse 71   Temp 98.2 F (36.8 C) (Oral)   Resp 16   Ht '5\' 10"'$  (1.778 m)   Wt 72 kg   SpO2 98%   BMI 22.78 kg/m  Physical Exam Vitals and nursing note reviewed. Exam conducted with a chaperone present.  Constitutional:      General: He is not in acute distress.     Appearance: Normal appearance.  HENT:     Head: Normocephalic and atraumatic.  Eyes:     General: No scleral icterus.    Extraocular Movements: Extraocular movements intact.     Pupils: Pupils are equal, round, and reactive to light.  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  Neurological:     Mental Status: He is alert. Mental status is at baseline.     Coordination: Coordination normal.     ED Results / Procedures / Treatments   Labs (all labs ordered are listed, but only abnormal results are displayed) Labs Reviewed  URINALYSIS, ROUTINE W REFLEX MICROSCOPIC  RPR  HIV ANTIBODY (ROUTINE TESTING W REFLEX)  GC/CHLAMYDIA PROBE AMP ([place]) NOT AT [facility]    EKG None  Radiology No results found.  Procedures Procedures    Medications Ordered in ED Medications  metroNIDAZOLE (FLAGYL) tablet 2,000 mg (has no administration in time range)    ED Course/ Medical Decision Making/ A&P                             Medical Decision Making Amount and/or Complexity of Data Reviewed Labs:  ordered.  Risk Prescription drug management.   This is a 27 year old male presenting to the emergency department due to persistent penile discharge x 2 weeks.  I reviewed external medical records including previous cytology and ED visits, he has been treated with multiple antibiotic regimens in the last few weeks despite negative gonorrhea and Chlamydia tests has not really improved.  He is nonseptic, no systemic illness and doubt PID.  Suspect urethritis versus refractory STD.  Will check syphilis, HIV, GC chlamydia and UA.  Will treat with 2 g Flagyl and refer to urologist if symptoms are persistent for additional evaluation and workup.        Final Clinical Impression(s) / ED Diagnoses Final diagnoses:  None    Rx / DC Orders ED  Discharge Orders     None         Sherrill Raring, PA-C 03/30/22 [place], [place], DO 03/30/22 1016

## 2022-03-30 NOTE — ED Notes (Signed)
Discharge instructions and follow up care reviewed and explained, pt verbalized understanding and had no further questions on d/c. Pt caox4, ambulatory, NAD on d/c.

## 2022-03-31 LAB — RPR: RPR Ser Ql: NONREACTIVE

## 2022-04-09 ENCOUNTER — Ambulatory Visit (INDEPENDENT_AMBULATORY_CARE_PROVIDER_SITE_OTHER): Payer: Managed Care, Other (non HMO) | Admitting: Internal Medicine

## 2022-04-09 ENCOUNTER — Encounter: Payer: Self-pay | Admitting: Internal Medicine

## 2022-04-09 ENCOUNTER — Other Ambulatory Visit (HOSPITAL_COMMUNITY)
Admission: RE | Admit: 2022-04-09 | Discharge: 2022-04-09 | Disposition: A | Discharge status: Home / Self Care | Payer: Managed Care, Other (non HMO) | Source: Ambulatory Visit | Attending: Internal Medicine | Admitting: Internal Medicine

## 2022-04-09 VITALS — BP 112/78 | HR 61 | Temp 98.3°F | Ht 70.0 in | Wt 147.2 lb

## 2022-04-09 DIAGNOSIS — N341 Nonspecific urethritis: Secondary | ICD-10-CM | POA: Insufficient documentation

## 2022-04-09 DIAGNOSIS — R109 Unspecified abdominal pain: Secondary | ICD-10-CM | POA: Insufficient documentation

## 2022-04-09 DIAGNOSIS — R112 Nausea with vomiting, unspecified: Secondary | ICD-10-CM | POA: Diagnosis not present

## 2022-04-09 DIAGNOSIS — R369 Urethral discharge, unspecified: Secondary | ICD-10-CM

## 2022-04-09 DIAGNOSIS — R1011 Right upper quadrant pain: Secondary | ICD-10-CM

## 2022-04-09 HISTORY — DX: Urethral discharge, unspecified: R36.9

## 2022-04-09 HISTORY — DX: Unspecified abdominal pain: R10.9

## 2022-04-09 MED ORDER — ONDANSETRON 4 MG PO TBDP: 4.0000 mg | ORAL_TABLET | Freq: Three times a day (TID) | ORAL | 0 refills | Status: DC | PRN

## 2022-04-09 MED ORDER — DOXYCYCLINE HYCLATE 100 MG PO CAPS: 100.0000 mg | ORAL_CAPSULE | Freq: Two times a day (BID) | ORAL | 0 refills | Status: DC

## 2022-04-09 NOTE — Progress Notes (Signed)
[facility]  Phone: [phone]  New patient visit  Visit Date: 04/09/2022 Patient: Todd Mcguire   DOB: 07-04-1995   27 y.o. Male  MRN: [id number] PCP: establishing today with  Loralee Pacas, MD   Assessment and Plan:   Farren was seen today for new patient (initial visit) and right side pain.  Right sided abdominal pain Overview: Started early February 2024 Always on right Crampy Pain 4/10 at worst Does not radiate Does not seem to associated with penile discharge which started after Associated with constipation. Not associated with nausea, vomiting, diarrhea, ors systemic signs and symptoms such as fever, myalgias, malaise, fatigue, chills, headaches History constipation as child  Associated with smoking heavy black and milds, became constant when he stopped. Has been losing weight intentionally in past 2 years Repeat urinalysis 2x in the interval showed specific gravity 1.34 and protein of 30 HIV, rpr, gc/chlamydia testing all negative within past month  Assessment & Plan:  We reviewed artificial intelligence and the plan is were going to just repeat and mainly think that is nongonococcal urethritis causing his symptoms I have course also advised him to try a gluten-free diet and stool softening for the constipation and if no improvement he may need to return to the ER.  I am also happy to see him again for a wellness visit where we can follow-up and see how the plan worked out and if we need to do anything else and will do a Gardasil shot and then if he can get it approved with his insurance.  In the meantime we are going to retest the urethral swab and look for alternative causes of urethritis which seems to be present  He says that he had doxycycline for a few days and thinks it was helping so I am going to extend that for about 2 or 3 weeks and assume that he has nongonococcal urethritis and prostatitis responsible for his symptoms and then bring  him back in 2 or 3 weeks to see if we need to do anything else and if not then just do the wellness visit  Patients chart review and interview were used to generate a prompt for artificial intelligence analysis ([employer] artificial intelligence) clinical decision support.  AI output was reviewed and is provided in red:   Comprehensive Review of the Case: A 27 year old male presents with right-sided abdominal pain and penile discharge, accompanied by constipation for a few weeks starting early February 2024. The abdominal pain is described as crampy, with a severity of 4/10 at its worst, non-radiating, and not associated with the penile discharge, which began subsequently. The patient has a history of constipation since childhood, which became constant upon cessation of heavy smoking of black and milds. He has been intentionally losing weight over the past two years. Repeat urinalysis showed a specific gravity of 1.034 and proteinuria of 30 mg/dL. HIV, RPR, and GC/Chlamydia testing were all negative within the past month. The case lacks details on demographics beyond age and sex, site of care, geographic region, travel history, family medical history, physical exam findings including vitals, and initial treatments with associated responses.  Clinical Problem Representation: A 27 year old male with a history of smoking and intentional weight loss presents with right-sided abdominal pain, penile discharge, and constipation. The abdominal pain is crampy, non-radiating, and not associated with systemic symptoms. Laboratory findings include a high specific gravity and proteinuria on urinalysis, with negative screenings for HIV, RPR, and GC/Chlamydia.   Most Likely  Diagnoses - Chlamydial Urethritis: Despite the negative GC/Chlamydia testing, the presentation of penile discharge in a young male could still suggest a chlamydial infection, as false negatives can occur depending on the timing of the test and the  method used. The abdominal pain and constipation might be unrelated symptoms, possibly due to dietary factors or stress.    - Non-Gonococcal Urethritis (NGU): NGU, often caused by organisms other than Neisseria gonorrhoeae, such as Mycoplasma genitalium, could explain the penile discharge. The abdominal pain might be a referred pain or unrelated, and the constipation could be a separate issue, possibly exacerbated by lifestyle factors.    - Irritable Bowel Syndrome (IBS): Given the chronic history of constipation since childhood, which worsened after quitting smoking (a known risk factor for exacerbating IBS symptoms), and the recent stress of intentional weight loss, IBS could explain the abdominal pain and constipation. The penile discharge might be an unrelated urological condition requiring further evaluation.   Expanded Differential Diagnoses - Psychogenic Polydipsia: The high specific gravity of the urine could indicate dehydration, possibly due to psychogenic polydipsia, where excessive fluid intake dilutes urine protein concentration. This condition might coexist with stress-related symptoms such as IBS, explaining the abdominal pain and constipation. The penile discharge would still need separate evaluation.    - Celiac Disease: Considering the chronic constipation and recent weight loss, celiac disease could be a potential diagnosis. It can present with a variety of gastrointestinal symptoms, including abdominal pain and constipation. The penile discharge would be an unrelated symptom.    - Prostatitis: Chronic prostatitis could explain both the penile discharge and the lower abdominal pain. It often presents with a variety of vague symptoms, including pelvic or abdominal discomfort and urinary changes, which might be mistaken for gastrointestinal issues.   Can't Miss Differential Diagnosis - Appendicitis: Although the pain description is not typical for appendicitis, it cannot be completely  ruled out without further imaging. Appendicitis is a surgical emergency and must be considered in the differential diagnosis of right-sided abdominal pain.    - Inflammatory Bowel Disease (IBD): Given the abdominal pain and change in bowel habits, conditions like Crohn's disease should be considered. Crohn's disease can present with extraintestinal manifestations, including urogenital symptoms.    - Urolithiasis: Kidney stones can present with abdominal pain and changes in urination, such as increased frequency or urgency, which might be misinterpreted as urinary tract infection symptoms. The specific gravity and proteinuria could also relate to this condition.    Clinical Problem Representation: A 27 year old male presents with right-sided abdominal pain, penile discharge, and constipation persisting for several weeks since early February 2024. The abdominal pain is described as crampy, with a severity of 4/10 at its worst, non-radiating, and not associated with systemic symptoms such as fever, myalgias, malaise, fatigue, chills, or headaches. The patient reports a history of constipation since childhood, which became constant upon cessation of smoking heavy black and milds. Additionally, the patient has been intentionally losing weight over the past two years. Laboratory findings include a repeat urinalysis showing a specific gravity of 1.34 and proteinuria of 30, with negative HIV, RPR, and GC/Chlamydia testing within the past month.  Differential Diagnosis (DDx): 1. Urethritis - suggested by the presence of penile discharge, with negative testing for common sexually transmitted infections (STIs) indicating a possible non-gonococcal etiology. 2. Irritable Bowel Syndrome (IBS) - considered due to chronic constipation and abdominal pain without systemic symptoms, potentially exacerbated by lifestyle changes (e.g., cessation of smoking). 3. Functional Constipation - a continuation of  childhood  constipation into adulthood, possibly aggravated by dietary or lifestyle factors. 4. Inflammatory Bowel Disease (IBD) - less likely given the absence of systemic symptoms, diarrhea, and blood in stool, but cannot be completely ruled out without further investigation. 5. Nephrological issues - indicated by proteinuria and abnormal specific gravity on urinalysis, necessitating further evaluation to rule out underlying renal pathology.  Plan:  1. Urethritis: - Dx: Further testing for less common causes of urethritis, such as Mycoplasma genitalium and Trichomonas vaginalis. Consider urethral swab and culture to identify specific pathogens. - Tx: Empirical treatment with antibiotics appropriate for coverage of both gonococcal and non-gonococcal urethritis, pending specific diagnostic results. Patient education on safe sexual practices and the importance of informing sexual partners.  2. Irritable Bowel Syndrome (IBS) / Functional Constipation: - Dx: Assessment for criteria matching the Rome IV diagnostic criteria for IBS, focusing on the pattern of abdominal pain and bowel habits. Evaluation for functional constipation with a detailed dietary and lifestyle history. - Tx: Dietary modifications including increased fiber intake, hydration, and possibly the use of probiotics. Consideration of pharmacological treatments such as laxatives for constipation-predominant IBS. Psychological support or stress management techniques if stress is identified as a significant factor.  3. Inflammatory Bowel Disease (IBD): - Dx: Colonoscopy with biopsy to assess for microscopic colitis or other forms of IBD if symptoms persist or worsen despite initial management. - Tx: Referral to a gastroenterologist for management if IBD is diagnosed, including the use of anti-inflammatory medications or immunosuppressants as indicated.  4. Nephrological Issues: - Dx: Further renal function tests including serum creatinine, blood urea  nitrogen (BUN), and a 24-hour urine collection for protein quantification. Consideration of renal ultrasound to assess for structural abnormalities. - Tx: Referral to nephrology for further evaluation and management based on the results of the renal workup. Management may include dietary modifications, control of blood pressure, and specific treatments based on the underlying cause of renal impairment.  Monitoring and Follow-Up: - Close follow-up to monitor response to treatment and progression of symptoms. - Re-evaluation of abdominal pain and constipation, adjusting management plans based on symptom response and diagnostic findings. - Regular monitoring of renal function and proteinuria levels if initial findings suggest nephrological involvement.  Patient Education: - Importance of adherence to treatment regimens and follow-up appointments. - Dietary and lifestyle modifications for the management of constipation and potential IBS. - Safe sexual practices and the significance of informing sexual partners about potential STI exposure.   Penile discharge Overview: Had recent negative sexual transmitted infection screening tests Girlfriend had bacterial vaginosis and yeast   Urethritis, nonspecific -     Cytology (oral, anal, urethral) ancillary only -     Mycoplasma / ureaplasma culture -     Doxycycline Hyclate; Take 1 capsule (100 mg total) by mouth 2 (two) times daily. One po bid x 7 days  Dispense: 28 capsule; Refill: 0  Nausea and vomiting, unspecified vomiting type -     Ondansetron; Take 1 tablet (4 mg total) by mouth every 8 (eight) hours as needed for nausea or vomiting (for nausea from wegovy or other source).  Dispense: 20 tablet; Refill: 0  Right upper quadrant abdominal pain     Medical Decision Making: [Medical Problems Considered] 1 undiagnosed new problem with uncertain prognosis     Ordering of each unique test;     Review of the result(s) of each unique test;   [Risk Management] Prescription drug management     Subjective:  Patient presents today to establish  care.  Chief Complaint  Patient presents with   New Patient (Initial Visit)    Wants to discuss HPV vaccine to make a decision.   Right side pain    Intermittent and dull for about a month.  Reviewed emergency room visit and testing for this same pain  Problem-oriented charting was used to develop and update his medical history: Problem  Right Sided Abdominal Pain   Started early February 2024 Always on right Crampy Pain 4/10 at worst Does not radiate Does not seem to associated with penile discharge which started after Associated with constipation. Not associated with nausea, vomiting, diarrhea, ors systemic signs and symptoms such as fever, myalgias, malaise, fatigue, chills, headaches History constipation as child  Associated with smoking heavy black and milds, became constant when he stopped. Has been losing weight intentionally in past 2 years Repeat urinalysis 2x in the interval showed specific gravity 1.34 and protein of 30 HIV, rpr, gc/chlamydia testing all negative within past month   Penile Discharge   Had recent negative sexual transmitted infection screening tests Girlfriend had bacterial vaginosis and yeast      Depression Screen    04/09/2022    9:21 AM  PHQ 2/9 Scores  PHQ - 2 Score 0   No results found for any visits on 04/09/22.  The following were reviewed and entered/updated into his MEDICAL RECORD [facility] History:  Diagnosis Date   Dizziness 05/22/2017   Heart murmur    Palpitations 05/22/2017   History reviewed. No pertinent surgical history. Family History  Problem Relation Age of Onset   Healthy Mother    Hypertension Father    Diabetes Father    Healthy Father    Outpatient Medications Prior to Visit  Medication Sig Dispense Refill   metroNIDAZOLE (FLAGYL) 500 MG tablet Take 1 tablet (500 mg total) by mouth 2 (two) times daily.  (Patient not taking: Reported on 04/09/2022) 14 tablet 0   ondansetron (ZOFRAN) 4 MG tablet Take 1 tablet (4 mg total) by mouth every 6 (six) hours. (Patient not taking: Reported on 04/09/2022) 12 tablet 0   doxycycline (VIBRAMYCIN) 100 MG capsule Take 1 capsule (100 mg total) by mouth 2 (two) times daily. One po bid x 7 days (Patient not taking: Reported on 04/09/2022) 14 capsule 0   No facility-administered medications prior to visit.    Allergies  Allergen Reactions   Cephalexin Nausea And Vomiting    Not an allergic reaction   Doxycycline Nausea And Vomiting    Not an allergic reaction.   Flexeril [Cyclobenzaprine] Other (See Comments)    Did not like the way it made him feel   Ibuprofen Other (See Comments)    Stomach pain at 600 mg dose; can take smaller doses   Social History   Tobacco Use   Smoking status: Some Days    Types: Cigars   Smokeless tobacco: Never  Vaping Use   Vaping Use: Never used  Substance Use Topics   Alcohol use: Yes    Comment: occasionally   Drug use: Not Currently    Comment: tobacco     There is no immunization history on file for this patient.  Objective:  BP 112/78 (BP Location: Left Arm, Patient Position: Sitting)   Pulse 61   Temp 98.3 F (36.8 C) (Temporal)   Ht '5\' 10"'$  (1.778 m)   Wt 147 lb 3.2 oz (66.8 kg)   SpO2 100%   BMI 21.12 kg/m  His Body mass index is  21.12 kg/m. indicates that he is Well developed, well nourished male , but waist circumference (truncal adiposity) is a better indicator of healthy body composition. He has  severity: no truncal adiposity in my medical opinion.  Vital signs reviewed.  Physical Exam  Wt Readings from Last 10 Encounters:  04/09/22 147 lb 3.2 oz (66.8 kg)  03/30/22 158 lb 11.7 oz (72 kg)  03/06/22 160 lb (72.6 kg)  01/12/22 160 lb (72.6 kg)  06/01/21 158 lb (71.7 kg)  02/16/21 150 lb 12.7 oz (68.4 kg)  02/13/21 150 lb (68 kg)  10/05/20 170 lb (77.1 kg)  08/05/20 170 lb (77.1 kg)  07/21/20 169  lb 12.1 oz (77 kg)   Weight trend (if available above) reviewed. General Appearance/Constitutional:  polite male in no acute distress Musculoskeletal: All extremities are intact.  Neurological:  Awake, alert,  No obvious focal neurological deficits or cognitive impairments Psychiatric:  Appropriate mood, pleasant demeanor Problem-specific findings:  none, no abdomen pain , soft nontender, nondistended   Results Reviewed (reviewed labs/imaging may be also be found in the assessment / plan section): Results for orders placed or performed during the hospital encounter of 03/30/22  Urinalysis, Routine w reflex microscopic -Urine, Clean Catch  Result Value Ref Range   Color, Urine YELLOW YELLOW   APPearance CLEAR CLEAR   Specific Gravity, Urine 1.034 (H) 1.005 - 1.030   pH 6.5 5.0 - 8.0   Glucose, UA NEGATIVE NEGATIVE mg/dL   Hgb urine dipstick NEGATIVE NEGATIVE   Bilirubin Urine NEGATIVE NEGATIVE   Ketones, ur NEGATIVE NEGATIVE mg/dL   Protein, ur 30 (A) NEGATIVE mg/dL   Nitrite NEGATIVE NEGATIVE   Leukocytes,Ua NEGATIVE NEGATIVE   RBC / HPF 0-5 0 - 5 RBC/hpf   WBC, UA 0-5 0 - 5 WBC/hpf   Bacteria, UA NONE SEEN NONE SEEN   Squamous Epithelial / HPF 0-5 0 - 5 /HPF   Mucus PRESENT   RPR  Result Value Ref Range   RPR Ser Ql NON REACTIVE NON REACTIVE  HIV Antibody (routine testing w rflx)  Result Value Ref Range   HIV Screen 4th Generation wRfx Non Reactive Non Reactive     Medical Decision Making: [Medical Problems Considered] 1 undiagnosed new problem with uncertain prognosis     Review of prior external note(s) from each unique source;  [Risk Management] Prescription drug management     Discussed avoiding pill esophagitis with doxycycline which seemed to limit prior treatment- also have Zofran to support adherence to this- might be able to remove as allergy.

## 2022-04-09 NOTE — Assessment & Plan Note (Signed)
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  I am also happy to see him again for a wellness visit where we can follow-up and see how the plan worked out and if we need to do anything else and will do a Gardasil shot and then if he can get it approved with his insurance.  In the meantime we are going to retest the urethral swab and look for alternative causes of urethritis which seems to be present  He says that he had doxycycline for a few days and thinks it was helping so I am going to extend that for about 2 or 3 weeks and assume that he has nongonococcal urethritis and prostatitis responsible for his symptoms and then bring him back in 2 or 3 weeks to see if we need to do anything else and if not then just do the wellness visit  Patients chart review and interview were used to generate a prompt for artificial intelligence analysis ([employer] artificial intelligence) clinical decision support.  AI output was reviewed and is provided in red:   Comprehensive Review of the Case: A 27 year old male presents with right-sided abdominal pain and penile discharge, accompanied by constipation for a few weeks starting early February 2024. The abdominal pain is described as crampy, with a severity of 4/10 at its worst, non-radiating, and not associated with the penile discharge, which began subsequently. The patient has a history of constipation since childhood, which became constant upon cessation of heavy smoking of black and milds. He has been intentionally losing weight over the past two years. Repeat urinalysis showed a specific gravity of 1.034 and proteinuria of 30 mg/dL. HIV, RPR, and GC/Chlamydia testing were all negative within the past month. The  case lacks details on demographics beyond age and sex, site of care, geographic region, travel history, family medical history, physical exam findings including vitals, and initial treatments with associated responses.  Clinical Problem Representation: A 27 year old male with a history of smoking and intentional weight loss presents with right-sided abdominal pain, penile discharge, and constipation. The abdominal pain is crampy, non-radiating, and not associated with systemic symptoms. Laboratory findings include a high specific gravity and proteinuria on urinalysis, with negative screenings for HIV, RPR, and GC/Chlamydia.   Most Likely Diagnoses - Chlamydial Urethritis: Despite the negative GC/Chlamydia testing, the presentation of penile discharge in a young male could still suggest a chlamydial infection, as false negatives can occur depending on the timing of the test and the method used. The abdominal pain and constipation might be unrelated symptoms, possibly due to dietary factors or stress.    - Non-Gonococcal Urethritis (NGU): NGU, often caused by organisms other than Neisseria gonorrhoeae, such as Mycoplasma genitalium, could explain the penile discharge. The abdominal pain might be a referred pain or unrelated, and the constipation could be a separate issue, possibly exacerbated by lifestyle factors.    - Irritable Bowel Syndrome (IBS): Given the chronic history of constipation since childhood, which worsened after quitting smoking (a known risk factor for exacerbating IBS symptoms), and the recent stress of intentional weight loss, IBS could explain the abdominal pain and constipation. The penile discharge might be an unrelated urological condition requiring further evaluation.   Expanded Differential Diagnoses - Psychogenic Polydipsia:  The high specific gravity of the urine could indicate dehydration, possibly due to psychogenic polydipsia, where excessive fluid intake dilutes urine protein  concentration. This condition might coexist with stress-related symptoms such as IBS, explaining the abdominal pain and constipation. The penile discharge would still need separate evaluation.    - Celiac Disease: Considering the chronic constipation and recent weight loss, celiac disease could be a potential diagnosis. It can present with a variety of gastrointestinal symptoms, including abdominal pain and constipation. The penile discharge would be an unrelated symptom.    - Prostatitis: Chronic prostatitis could explain both the penile discharge and the lower abdominal pain. It often presents with a variety of vague symptoms, including pelvic or abdominal discomfort and urinary changes, which might be mistaken for gastrointestinal issues.   Can't Miss Differential Diagnosis - Appendicitis: Although the pain description is not typical for appendicitis, it cannot be completely ruled out without further imaging. Appendicitis is a surgical emergency and must be considered in the differential diagnosis of right-sided abdominal pain.    - Inflammatory Bowel Disease (IBD): Given the abdominal pain and change in bowel habits, conditions like Crohn's disease should be considered. Crohn's disease can present with extraintestinal manifestations, including urogenital symptoms.    - Urolithiasis: Kidney stones can present with abdominal pain and changes in urination, such as increased frequency or urgency, which might be misinterpreted as urinary tract infection symptoms. The specific gravity and proteinuria could also relate to this condition.    Clinical Problem Representation: A 27 year old male presents with right-sided abdominal pain, penile discharge, and constipation persisting for several weeks since early February 2024. The abdominal pain is described as crampy, with a severity of 4/10 at its worst, non-radiating, and not associated with systemic symptoms such as fever, myalgias, malaise, fatigue,  chills, or headaches. The patient reports a history of constipation since childhood, which became constant upon cessation of smoking heavy black and milds. Additionally, the patient has been intentionally losing weight over the past two years. Laboratory findings include a repeat urinalysis showing a specific gravity of 1.34 and proteinuria of 30, with negative HIV, RPR, and GC/Chlamydia testing within the past month.  Differential Diagnosis (DDx): 1. Urethritis - suggested by the presence of penile discharge, with negative testing for common sexually transmitted infections (STIs) indicating a possible non-gonococcal etiology. 2. Irritable Bowel Syndrome (IBS) - considered due to chronic constipation and abdominal pain without systemic symptoms, potentially exacerbated by lifestyle changes (e.g., cessation of smoking). 3. Functional Constipation - a continuation of childhood constipation into adulthood, possibly aggravated by dietary or lifestyle factors. 4. Inflammatory Bowel Disease (IBD) - less likely given the absence of systemic symptoms, diarrhea, and blood in stool, but cannot be completely ruled out without further investigation. 5. Nephrological issues - indicated by proteinuria and abnormal specific gravity on urinalysis, necessitating further evaluation to rule out underlying renal pathology.  Plan:  1. Urethritis: - Dx: Further testing for less common causes of urethritis, such as Mycoplasma genitalium and Trichomonas vaginalis. Consider urethral swab and culture to identify specific pathogens. - Tx: Empirical treatment with antibiotics appropriate for coverage of both gonococcal and non-gonococcal urethritis, pending specific diagnostic results. Patient education on safe sexual practices and the importance of informing sexual partners.  2. Irritable Bowel Syndrome (IBS) / Functional Constipation: - Dx: Assessment for criteria matching the Rome IV diagnostic criteria for IBS, focusing on  the pattern of abdominal pain and bowel habits. Evaluation for functional constipation with a detailed dietary and lifestyle history. -  Tx: Dietary modifications including increased fiber intake, hydration, and possibly the use of probiotics. Consideration of pharmacological treatments such as laxatives for constipation-predominant IBS. Psychological support or stress management techniques if stress is identified as a significant factor.  3. Inflammatory Bowel Disease (IBD): - Dx: Colonoscopy with biopsy to assess for microscopic colitis or other forms of IBD if symptoms persist or worsen despite initial management. - Tx: Referral to a gastroenterologist for management if IBD is diagnosed, including the use of anti-inflammatory medications or immunosuppressants as indicated.  4. Nephrological Issues: - Dx: Further renal function tests including serum creatinine, blood urea nitrogen (BUN), and a 24-hour urine collection for protein quantification. Consideration of renal ultrasound to assess for structural abnormalities. - Tx: Referral to nephrology for further evaluation and management based on the results of the renal workup. Management may include dietary modifications, control of blood pressure, and specific treatments based on the underlying cause of renal impairment.  Monitoring and Follow-Up: - Close follow-up to monitor response to treatment and progression of symptoms. - Re-evaluation of abdominal pain and constipation, adjusting management plans based on symptom response and diagnostic findings. - Regular monitoring of renal function and proteinuria levels if initial findings suggest nephrological involvement.  Patient Education: - Importance of adherence to treatment regimens and follow-up appointments. - Dietary and lifestyle modifications for the management of constipation and potential IBS. - Safe sexual practices and the significance of informing sexual partners about potential STI  exposure.

## 2022-04-09 NOTE — Patient Instructions (Signed)
HPV and Men: What You Need to Know What is HPV? HPV (human papillomavirus) is a common virus that spreads through intimate skin-to-skin or sexual contact. Nearly all people will get at least one type of HPV at some point in their lives. Most HPV infections go away on their own within 2 years, but some can persist and lead to health problems later. Health Problems Caused by HPV in Men Penile Cancer: HPV infections can contribute to penile cancer. Anal Cancer: Both men and women can develop anal cancer due to HPV. Oropharyngeal Cancer: HPV can cause cancers of the tonsils, base of the tongue, and back of the throat. Anogenital Warts: HPV infections can lead to warts in the genital and anal areas. Gardasil Vaccine Gardasil is an HPV vaccine that helps prevent infection with certain types of HPV. It is safe and effective for both men and women. Key points about Gardasil: Recommended Age: Routine vaccination is recommended for adolescents at 1 or 25 years of age to ensure protection before exposure to the virus. Age Range: Vaccination is recommended for everyone through 25 years of age. Adults: Adults aged 72 to 14 years can also receive the vaccine after discussing with their healthcare provider. Dosing: Most children who receive the first dose before 25 years of age need 2 doses. Those who start at or after 25 years of age and individuals with certain immunocompromising conditions need 3 doses. Side Effects: Common side effects include soreness, redness, or swelling at the injection site, fever, and headache. Fainting can occur after vaccination. Severe allergic reactions are extremely rare. Consult Your Healthcare Provider: Talk to your healthcare provider about the vaccine. Inform them about any allergies or health conditions. Vaccination can be given alongside other vaccines. Conclusion HPV vaccination is crucial for preventing HPV-related health issues. Consult your healthcare  provider to determine if Gardasil is right for you.

## 2022-04-10 LAB — CYTOLOGY, (ORAL, ANAL, URETHRAL) ANCILLARY ONLY
Chlamydia: NEGATIVE
Comment: NEGATIVE
Comment: NEGATIVE
Comment: NORMAL
Neisseria Gonorrhea: NEGATIVE
Trichomonas: NEGATIVE

## 2022-04-16 LAB — MYCOPLASMA / UREAPLASMA CULTURE

## 2022-04-23 ENCOUNTER — Encounter: Payer: Managed Care, Other (non HMO) | Admitting: Internal Medicine

## 2022-05-14 ENCOUNTER — Other Ambulatory Visit (HOSPITAL_COMMUNITY)
Admission: RE | Admit: 2022-05-14 | Discharge: 2022-05-14 | Disposition: A | Discharge status: Home / Self Care | Payer: Managed Care, Other (non HMO) | Source: Ambulatory Visit | Attending: Internal Medicine | Admitting: Internal Medicine

## 2022-05-14 ENCOUNTER — Encounter: Payer: Self-pay | Admitting: Internal Medicine

## 2022-05-14 ENCOUNTER — Ambulatory Visit (INDEPENDENT_AMBULATORY_CARE_PROVIDER_SITE_OTHER): Payer: Managed Care, Other (non HMO) | Admitting: Internal Medicine

## 2022-05-14 VITALS — BP 110/62 | HR 63 | Temp 98.5°F | Ht 70.0 in | Wt 148.8 lb

## 2022-05-14 DIAGNOSIS — J302 Other seasonal allergic rhinitis: Secondary | ICD-10-CM | POA: Diagnosis not present

## 2022-05-14 DIAGNOSIS — R369 Urethral discharge, unspecified: Secondary | ICD-10-CM | POA: Diagnosis not present

## 2022-05-14 DIAGNOSIS — N341 Nonspecific urethritis: Secondary | ICD-10-CM

## 2022-05-14 DIAGNOSIS — R8290 Unspecified abnormal findings in urine: Secondary | ICD-10-CM | POA: Insufficient documentation

## 2022-05-14 LAB — POCT URINALYSIS DIPSTICK
Bilirubin, UA: NEGATIVE
Blood, UA: NEGATIVE
Glucose, UA: NEGATIVE
Ketones, UA: 5
Leukocytes, UA: NEGATIVE
Nitrite, UA: NEGATIVE
Protein, UA: POSITIVE — AB
Spec Grav, UA: 1.015 (ref 1.010–1.025)
Urobilinogen, UA: 0.2 E.U./dL
pH, UA: 7 (ref 5.0–8.0)

## 2022-05-14 MED ORDER — DIPHENHYDRAMINE HCL 25 MG PO TABS
25.0000 mg | ORAL_TABLET | Freq: Four times a day (QID) | ORAL | 0 refills | Status: DC | PRN
Start: 2022-05-14 — End: 2022-06-09

## 2022-05-14 MED ORDER — OLOPATADINE HCL 0.2 % OP SOLN
OPHTHALMIC | 5 refills | Status: DC
Start: 2022-05-14 — End: 2022-06-09

## 2022-05-14 MED ORDER — SIMPLY SALINE 0.9 % NA AERS: 2.0000 | INHALATION_SPRAY | Freq: Every evening | NASAL | 11 refills | Status: DC

## 2022-05-14 MED ORDER — DOXYCYCLINE HYCLATE 100 MG PO TABS: 100.0000 mg | ORAL_TABLET | Freq: Two times a day (BID) | ORAL | 0 refills | Status: DC

## 2022-05-14 MED ORDER — FLUTICASONE PROPIONATE 50 MCG/ACT NA SUSP
2.0000 | Freq: Every day | NASAL | 6 refills | Status: DC
Start: 2022-05-14 — End: 2022-06-09

## 2022-05-14 MED ORDER — LEVOCETIRIZINE DIHYDROCHLORIDE 5 MG PO TABS
5.0000 mg | ORAL_TABLET | Freq: Every evening | ORAL | 5 refills | Status: DC
Start: 2022-05-14 — End: 2022-06-09

## 2022-05-14 NOTE — Assessment & Plan Note (Signed)
Clearing up with leftover doxycycline Gave more, he seems to be passing back and forth with partners Advised patient to complete 7 days course and have some leftover so he takes immediately if he resumes sexual activity with same partner Advised patient to inform partner they should take at same time or refrain from sex until both have completed.

## 2022-05-14 NOTE — Progress Notes (Signed)
[facility]: [phone]   Routine Medical Office Visit  Patient:  Todd Mcguire      Age: 27 y.o.       Sex:  male  Date:   05/14/2022 PCP:    Lula Olszewski, MD   Today's Healthcare Provider: Lula Olszewski, MD   Assessment and Plan:   Cloudy urine -     POCT urinalysis dipstick -     Urine cytology ancillary only; Future -     Doxycycline Hyclate; Take 1 tablet (100 mg total) by mouth 2 (two) times daily.  Dispense: 28 tablet; Refill: 0  Urethritis, nonspecific -     Urine cytology ancillary only; Future -     Doxycycline Hyclate; Take 1 tablet (100 mg total) by mouth 2 (two) times daily.  Dispense: 28 tablet; Refill: 0  Penile discharge Assessment & Plan: Clearing up with leftover doxycycline Gave more, he seems to be passing back and forth with partners Advised patient to complete 7 days course and have some leftover so he takes immediately if he resumes sexual activity with same partner Advised patient to inform partner they should take at same time or refrain from sex until both have completed.  Orders: -     Urine cytology ancillary only; Future -     Doxycycline Hyclate; Take 1 tablet (100 mg total) by mouth 2 (two) times daily.  Dispense: 28 tablet; Refill: 0  Seasonal allergies Assessment & Plan: Gave extensive medication(s) treatment(s) options and referral as requested.  Orders: -     Simply Saline; Place 2 sprays into the nose at bedtime. Spray each nostril nightly to rinse sinuses.  Additional unlimited sprays as needed nasal congestion.  Dispense: 126 mL; Refill: 11 -     Fluticasone Propionate; Place 2 sprays into both nostrils daily.  Dispense: 16 g; Refill: 6 -     Levocetirizine Dihydrochloride; Take 1 tablet (5 mg total) by mouth every evening.  Dispense: 30 tablet; Refill: 5 -     diphenhydrAMINE HCl; Take 1 tablet (25 mg total) by mouth every 6 (six) hours as needed. Severe drowsiness take only as needed for allergies  persisting at bedtime  Dispense: 30 tablet; Refill: 0 -     Olopatadine HCl; Apply 1 drop to each eye once daily.  Dispense: 2.5 mL; Refill: 5 -     Ambulatory referral to Allergy          Clinical Presentation:   27 y.o. male here today for Cloudy discharge in urine (The next morning, after consuming alcohol. Was sexually active recently 1-2 weeks ), Dysuria, and Dehydration     Reviewed:  has a past medical history of Dizziness (05/22/2017), Heart murmur, Palpitations (05/22/2017), Penile discharge (04/09/2022), and Right sided abdominal pain (04/09/2022). Active Ambulatory Problems    Diagnosis Date Noted   Palpitations 05/22/2017   Dizziness 05/22/2017   Heart murmur    Seasonal allergies 05/14/2022   Resolved Ambulatory Problems    Diagnosis Date Noted   Right sided abdominal pain 04/09/2022   Penile discharge 04/09/2022   No Additional Past Medical History    Outpatient Medications Prior to Visit  Medication Sig   ondansetron (ZOFRAN) 4 MG tablet Take 1 tablet (4 mg total) by mouth every 6 (six) hours. (Patient not taking: Reported on 04/09/2022)   ondansetron (ZOFRAN-ODT) 4 MG disintegrating tablet Take 1 tablet (4 mg total) by mouth every 8 (eight) hours as needed for nausea or vomiting (for nausea  from wegovy or other source). (Patient not taking: Reported on 05/14/2022)   [DISCONTINUED] doxycycline (VIBRAMYCIN) 100 MG capsule Take 1 capsule (100 mg total) by mouth 2 (two) times daily. One po bid x 7 days (Patient not taking: Reported on 05/14/2022)   [DISCONTINUED] metroNIDAZOLE (FLAGYL) 500 MG tablet Take 1 tablet (500 mg total) by mouth 2 (two) times daily. (Patient not taking: Reported on 04/09/2022)   No facility-administered medications prior to visit.    Was sexually active with prior partner 1-2 week(s) ago.  Had urethritis resolved with doxycycline but after sex with prior partner had cloudy discharge and dysuria. Had leftover antibiotic(s) doxycycline and took and  discharge and discomfort have gone away but he is out of medications now and only took 4 days worth and not certain about where things will go with the partner  Dysuria  This is a recurrent problem. The current episode started in the past 7 days. The problem has been resolved. The quality of the pain is described as aching and burning. Associated symptoms include a discharge. Pertinent negatives include no chills. He has tried antibiotics for the symptoms. The treatment provided significant relief.    Updated and modified:  Problem  Seasonal Allergies   Severe, zyrtec doesn't help Gets in eyes and nose Request medications and referral    Right Sided Abdominal Pain (Resolved)   Started early February 2024 Always on right Crampy Pain 4/10 at worst Does not radiate Does not seem to associated with penile discharge which started after Associated with constipation. Not associated with nausea, vomiting, diarrhea, ors systemic signs and symptoms such as fever, myalgias, malaise, fatigue, chills, headaches History constipation as child  Associated with smoking heavy black and milds, became constant when he stopped. Has been losing weight intentionally in past 2 years Repeat urinalysis 2x in the interval showed specific gravity 1.34 and protein of 30 HIV, rpr, gc/chlamydia testing all negative within past month   Penile Discharge (Resolved)   Had recent negative sexual transmitted infection screening tests Girlfriend had bacterial vaginosis and yeast              Clinical Data Analysis:   Physical Exam  BP 110/62 (BP Location: Left Arm, Patient Position: Sitting)   Pulse 63   Temp 98.5 F (36.9 C) (Temporal)   Ht 5\' 10"  (1.778 m)   Wt 148 lb 12.8 oz (67.5 kg)   SpO2 99%   BMI 21.35 kg/m  Wt Readings from Last 10 Encounters:  05/14/22 148 lb 12.8 oz (67.5 kg)  04/09/22 147 lb 3.2 oz (66.8 kg)  03/30/22 158 lb 11.7 oz (72 kg)  03/06/22 160 lb (72.6 kg)  01/12/22 160 lb  (72.6 kg)  06/01/21 158 lb (71.7 kg)  02/16/21 150 lb 12.7 oz (68.4 kg)  02/13/21 150 lb (68 kg)  10/05/20 170 lb (77.1 kg)  08/05/20 170 lb (77.1 kg)   Vital signs reviewed.  Nursing notes reviewed. Weight trend reviewed. Abnormalities and Problem-Specific physical exam findings:  genitals not examined, problem currently in remission with at home antibiotic(s). General Appearance:  No acute distress appreciable.   Well-groomed, healthy-appearing male.  Well proportioned with no abnormal fat distribution.  Good muscle tone. Skin: Clear and well-hydrated. Pulmonary:  Normal work of breathing at rest, no respiratory distress apparent. SpO2: 99 %  Musculoskeletal: Patient demonstrates smooth and coordinated movements throughout all major joints.All extremities are intact.  Neurological:  Awake, alert, oriented, and engaged.  No obvious focal neurological deficits or cognitive  impairments.  Sensorium seems unclouded. Gait is smooth and coordinated.  Speech is clear and coherent with logical content. Psychiatric:  Appropriate mood, pleasant and cooperative demeanor, cheerful and engaged during the exam    Additional Results Reviewed:     Results for orders placed or performed in visit on 05/14/22  POCT Urinalysis Dipstick  Result Value Ref Range   Color, UA yellow    Clarity, UA clear    Glucose, UA Negative Negative   Bilirubin, UA Negative    Ketones, UA 5    Spec Grav, UA 1.015 1.010 - 1.025   Blood, UA Negative    pH, UA 7.0 5.0 - 8.0   Protein, UA Positive (A) Negative   Urobilinogen, UA 0.2 0.2 or 1.0 E.U./dL   Nitrite, UA Negative    Leukocytes, UA Negative Negative   Appearance     Odor      Recent Results (from the past 2160 hour(s))  Urinalysis, Routine w reflex microscopic -Urine, Clean Catch     Status: None   Collection Time: 03/06/22 10:33 AM  Result Value Ref Range   Color, Urine YELLOW YELLOW   APPearance CLEAR CLEAR   Specific Gravity, Urine 1.020 1.005 -  1.030   pH 7.5 5.0 - 8.0   Glucose, UA NEGATIVE NEGATIVE mg/dL   Hgb urine dipstick NEGATIVE NEGATIVE   Bilirubin Urine NEGATIVE NEGATIVE   Ketones, ur NEGATIVE NEGATIVE mg/dL   Protein, ur NEGATIVE NEGATIVE mg/dL   Nitrite NEGATIVE NEGATIVE   Leukocytes,Ua NEGATIVE NEGATIVE    Comment: Performed at [facility], [address] [place], [place] [address]  GC/Chlamydia probe amp [facility]) not at [facility]     Status: None   Collection Time: 03/06/22 10:33 AM  Result Value Ref Range   Neisseria Gonorrhea Negative    Chlamydia Negative    Comment Normal Reference Ranger Chlamydia - Negative    Comment      Normal Reference Range Neisseria Gonorrhea - Negative  Urinalysis, w/ Reflex to Culture (Infection Suspected) -Urine, Clean Catch     Status: None   Collection Time: 03/06/22 12:26 PM  Result Value Ref Range   Specimen Source URINE, CLEAN CATCH    Color, Urine YELLOW YELLOW   APPearance CLEAR CLEAR   Specific Gravity, Urine 1.020 1.005 - 1.030   pH 7.5 5.0 - 8.0   Glucose, UA NEGATIVE NEGATIVE mg/dL   Hgb urine dipstick NEGATIVE NEGATIVE   Bilirubin Urine NEGATIVE NEGATIVE   Ketones, ur NEGATIVE NEGATIVE mg/dL   Protein, ur NEGATIVE NEGATIVE mg/dL   Nitrite NEGATIVE NEGATIVE   Leukocytes,Ua NEGATIVE NEGATIVE   RBC / HPF 0-5 0 - 5 RBC/hpf   WBC, UA 0-5 0 - 5 WBC/hpf    Comment:        Reflex urine culture not performed if WBC <=10, OR if Squamous epithelial cells >5. If Squamous epithelial cells >5 suggest recollection.    Bacteria, UA NONE SEEN NONE SEEN   Squamous Epithelial / HPF 0-5 0 - 5 /HPF   Mucus PRESENT     Comment: Performed at [facility], [address], [place], [place] [address]  Urinalysis, w/ Reflex to Culture (Infection Suspected) -Urine, Clean Catch     Status: Abnormal   Collection Time: 03/14/22  3:14 PM  Result Value Ref Range   Specimen Source URINE, CLEAN CATCH    Color, Urine YELLOW YELLOW   APPearance  CLEAR CLEAR   Specific Gravity, Urine 1.035 (H) 1.005 -  1.030   pH 7.0 5.0 - 8.0   Glucose, UA NEGATIVE NEGATIVE mg/dL   Hgb urine dipstick NEGATIVE NEGATIVE   Bilirubin Urine NEGATIVE NEGATIVE   Ketones, ur NEGATIVE NEGATIVE mg/dL   Protein, ur 30 (A) NEGATIVE mg/dL   Nitrite NEGATIVE NEGATIVE   Leukocytes,Ua NEGATIVE NEGATIVE   RBC / HPF 0-5 0 - 5 RBC/hpf   WBC, UA 0-5 0 - 5 WBC/hpf    Comment:        Reflex urine culture not performed if WBC <=10, OR if Squamous epithelial cells >5. If Squamous epithelial cells >5 suggest recollection.    Bacteria, UA NONE SEEN NONE SEEN   Squamous Epithelial / HPF 0-5 0 - 5 /HPF   Mucus PRESENT    Hyaline Casts, UA PRESENT     Comment: Performed at [facility], [address] [place], [place] [address]  POCT urinalysis dipstick     Status: Abnormal   Collection Time: 03/26/22 11:54 AM  Result Value Ref Range   Color, UA yellow yellow   Clarity, UA clear clear   Glucose, UA negative negative mg/dL   Bilirubin, UA negative negative   Ketones, POC UA negative negative mg/dL   Spec Grav, UA 6.045 4.098 - 1.025   Blood, UA negative negative   pH, UA 7.5 5.0 - 8.0   Protein Ur, POC trace (A) negative mg/dL   Urobilinogen, UA 0.2 0.2 or 1.0 E.U./dL   Nitrite, UA Negative Negative   Leukocytes, UA Negative Negative  Cytology (oral, anal, urethral) ancillary only     Status: None   Collection Time: 03/26/22 12:19 PM  Result Value Ref Range   Neisseria Gonorrhea Negative    Chlamydia Negative    Trichomonas Negative    Comment Normal Reference Ranger Chlamydia - Negative    Comment      Normal Reference Range Neisseria Gonorrhea - Negative   Comment Normal Reference Range Trichomonas - Negative   Urinalysis, Routine w reflex microscopic -Urine, Clean Catch     Status: Abnormal   Collection Time: 03/30/22 10:05 AM  Result Value Ref Range   Color, Urine YELLOW YELLOW   APPearance CLEAR CLEAR   Specific Gravity, Urine  1.034 (H) 1.005 - 1.030   pH 6.5 5.0 - 8.0   Glucose, UA NEGATIVE NEGATIVE mg/dL   Hgb urine dipstick NEGATIVE NEGATIVE   Bilirubin Urine NEGATIVE NEGATIVE   Ketones, ur NEGATIVE NEGATIVE mg/dL   Protein, ur 30 (A) NEGATIVE mg/dL   Nitrite NEGATIVE NEGATIVE   Leukocytes,Ua NEGATIVE NEGATIVE   RBC / HPF 0-5 0 - 5 RBC/hpf   WBC, UA 0-5 0 - 5 WBC/hpf   Bacteria, UA NONE SEEN NONE SEEN   Squamous Epithelial / HPF 0-5 0 - 5 /HPF   Mucus PRESENT     Comment: Performed at [facility], [address] [place], [place] [address]  RPR     Status: None   Collection Time: 03/30/22 10:05 AM  Result Value Ref Range   RPR Ser Ql NON REACTIVE NON REACTIVE    Comment: Performed at [facility], [address], [place], [place] [address]  HIV Antibody (routine testing w rflx)     Status: None   Collection Time: 03/30/22 10:05 AM  Result Value Ref Range   HIV Screen 4th Generation wRfx Non Reactive Non Reactive    Comment: Performed at [facility], [address] [place], [place] [address]  Cytology (oral, anal, urethral)  ancillary only     Status: None   Collection Time: 04/09/22 10:11 AM  Result Value Ref Range   Neisseria Gonorrhea Negative    Chlamydia Negative    Trichomonas Negative    Comment Normal Reference Ranger Chlamydia - Negative    Comment      Normal Reference Range Neisseria Gonorrhea - Negative   Comment Normal Reference Range Trichomonas - Negative   Mycoplasma / ureaplasma culture     Status: None   Collection Time: 04/09/22 10:16 AM   Specimen: Genital  Result Value Ref Range   Specimen Source (CALR) GENITAL    STATUS FINAL    MYCOPLASMA HOMINIS: NOT ISOLATED    UREAPLASMA SPECIES: NOT ISOLATED     Comment: REFERENCE RANGE: NOT ISOLATED  POCT Urinalysis Dipstick     Status: Abnormal   Collection Time: 05/14/22  2:43 PM  Result Value Ref Range   Color, UA yellow    Clarity, UA clear    Glucose, UA Negative Negative   Bilirubin, UA  Negative    Ketones, UA 5    Spec Grav, UA 1.015 1.010 - 1.025   Blood, UA Negative    pH, UA 7.0 5.0 - 8.0   Protein, UA Positive (A) Negative   Urobilinogen, UA 0.2 0.2 or 1.0 E.U./dL   Nitrite, UA Negative    Leukocytes, UA Negative Negative   Appearance     Odor      No image results found.   CT Renal Stone Study  Result Date: 03/14/2022 CLINICAL DATA:  Abdominal and flank pain over the last month EXAM: CT ABDOMEN AND PELVIS WITHOUT CONTRAST TECHNIQUE: Multidetector CT imaging of the abdomen and pelvis was performed following the standard protocol without IV contrast. RADIATION DOSE REDUCTION: This exam was performed according to the departmental dose-optimization program which includes automated exposure control, adjustment of the mA and/or kV according to patient size and/or use of iterative reconstruction technique. COMPARISON:  Abdomen radiograph 10/12/2015 FINDINGS: Lower chest: Unremarkable Hepatobiliary: Unremarkable Pancreas: Unremarkable Spleen: Unremarkable Adrenals/Urinary Tract: Unremarkable. No urinary tract calculi or hydronephrosis. Stomach/Bowel: Upper normal appendiceal diameter at 0.7 cm, also with gas in segments of the appendix and no substantial periappendiceal inflammatory stranding, overall considered within normal limits. Vascular/Lymphatic: Unremarkable Reproductive: Unremarkable Other: No supplemental non-categorized findings. Musculoskeletal: Unremarkable IMPRESSION: 1. A specific cause for the patient's abdominal pain is not identified. No urinary tract calculi or hydronephrosis. Electronically Signed   By: Gaylyn RongWalter  Liebkemann M.D.   On: 03/14/2022 17:30     --------------------------------    Signed: Lula Olszewskiyan G Arslan Kier, MD 05/14/2022 3:09 PM

## 2022-05-14 NOTE — Assessment & Plan Note (Signed)
Gave extensive medication(s) treatment(s) options and referral as requested.

## 2022-05-16 LAB — URINE CYTOLOGY ANCILLARY ONLY
Chlamydia: NEGATIVE
Comment: NEGATIVE
Comment: NEGATIVE
Comment: NORMAL
Neisseria Gonorrhea: NEGATIVE
Trichomonas: NEGATIVE

## 2022-06-01 ENCOUNTER — Telehealth: Payer: Self-pay | Admitting: Internal Medicine

## 2022-06-01 NOTE — Telephone Encounter (Signed)
Pt scheduled for 4/29

## 2022-06-01 NOTE — Telephone Encounter (Signed)
Prescription Request  06/01/2022  LOV: 05/14/2022  What is the name of the medication or equipment?  doxycycline (VIBRA-TABS) 100 MG tablet   Have you contacted your pharmacy to request a refill? Yes   Which pharmacy would you like this sent to?  [id number] [place], [place] - [address] [phone] [address] [place] [address]  Patient notified that their request is being sent to the clinical staff for review and that they should receive a response within 2 business days.   Please advise at Mobile [phone] (mobile)

## 2022-06-04 ENCOUNTER — Encounter: Payer: Self-pay | Admitting: Family Medicine

## 2022-06-04 ENCOUNTER — Ambulatory Visit (INDEPENDENT_AMBULATORY_CARE_PROVIDER_SITE_OTHER): Payer: Managed Care, Other (non HMO) | Admitting: Family Medicine

## 2022-06-04 VITALS — BP 120/60 | HR 70 | Temp 98.7°F | Ht 70.0 in | Wt 149.0 lb

## 2022-06-04 DIAGNOSIS — N4889 Other specified disorders of penis: Secondary | ICD-10-CM | POA: Diagnosis not present

## 2022-06-04 NOTE — Progress Notes (Signed)
Subjective  CC:  Chief Complaint  Patient presents with   itchy penis   Same day acute visit; PCP not available. New pt to me. Chart reviewed.   HPI: Todd Mcguire is a 27 y.o. male who presents to the office today to address the problems listed above in the chief complaint. 56 year old healthy male who is here because his girlfriend is concerned that he is passing infections to her.  She has had recurrent yeast and BV.  He says he feels fine although at times he is having some scratchiness as he is describes it.  I reviewed his chart, multiple ER visits and doctor visits for concerns of penile discharge.  Has had multiple tests for STDs that have all been negative.  He has been treated multiple times with antibiotics in spite of negative testing.  He reports that he feels fine.  No penile sores no discharge at this time.  No redness.  No swelling at the head of the penis.  He does use condoms intermittently. He recently completed a 2-week course of doxycycline.  His last visit for this was April 8. No visits with results within 1 Day(s) from this visit.  Latest known visit with results is:  Office Visit on 05/14/2022  Component Date Value Ref Range Status   Color, UA 05/14/2022 yellow   Final   Clarity, UA 05/14/2022 clear   Final   Glucose, UA 05/14/2022 Negative  Negative Final   Bilirubin, UA 05/14/2022 Negative   Final   Ketones, UA 05/14/2022 5   Final   Spec Grav, UA 05/14/2022 1.015  1.010 - 1.025 Final   Blood, UA 05/14/2022 Negative   Final   pH, UA 05/14/2022 7.0  5.0 - 8.0 Final   Protein, UA 05/14/2022 Positive (A)  Negative Final   Urobilinogen, UA 05/14/2022 0.2  0.2 or 1.0 E.U./dL Final   Nitrite, UA 16/11/9602 Negative   Final   Leukocytes, UA 05/14/2022 Negative  Negative Final   Neisseria Gonorrhea 05/14/2022 Negative   Final   Chlamydia 05/14/2022 Negative   Final   Trichomonas 05/14/2022 Negative   Final   Comment 05/14/2022 Normal Reference Range  Trichomonas - Negative   Final   Comment 05/14/2022 Normal Reference Ranger Chlamydia - Negative   Final   Comment 05/14/2022 Normal Reference Range Neisseria Gonorrhea - Negative   Final    He has had 1 positive chlamydia test back in 2019. Assessment  1. Penile irritation      Plan  Penile irritation: Reassured..  He has no symptoms of infection.  Discussed how typically BV is not transmitted back-and-forth.  Yeast infections can be transmitted but he would have symptoms including swelling and itching at the head of the penis.  He will monitor for any symptoms.  He has no urinary symptoms.  Follow up: As scheduled 06/08/2022  No orders of the defined types were placed in this encounter.  No orders of the defined types were placed in this encounter.     I reviewed the patients updated PMH, FH, and SocHx.    Patient Active Problem List   Diagnosis Date Noted   Seasonal allergies 05/14/2022   Palpitations 05/22/2017   Dizziness 05/22/2017   Heart murmur    Current Meds  Medication Sig   diphenhydrAMINE (BENADRYL ALLERGY) 25 MG tablet Take 1 tablet (25 mg total) by mouth every 6 (six) hours as needed. Severe drowsiness take only as needed for allergies persisting at bedtime  fluticasone (FLONASE) 50 MCG/ACT nasal spray Place 2 sprays into both nostrils daily.   levocetirizine (XYZAL ALLERGY 24HR) 5 MG tablet Take 1 tablet (5 mg total) by mouth every evening.   Olopatadine HCl (PATADAY) 0.2 % SOLN Apply 1 drop to each eye once daily.   Saline (SIMPLY SALINE) 0.9 % AERS Place 2 sprays into the nose at bedtime. Spray each nostril nightly to rinse sinuses.  Additional unlimited sprays as needed nasal congestion.    Allergies: Patient is allergic to cephalexin, flexeril [cyclobenzaprine], and ibuprofen. Family History: Patient family history includes Diabetes in his father; Healthy in his father and mother; Hypertension in his father. Social History:  Patient  reports that he has  been smoking cigars. He has never used smokeless tobacco. He reports current alcohol use. He reports that he does not currently use drugs.  Review of Systems: Constitutional: Negative for fever malaise or anorexia Cardiovascular: negative for chest pain Respiratory: negative for SOB or persistent cough Gastrointestinal: negative for abdominal pain  Objective  Vitals: BP 120/60   Pulse 70   Temp 98.7 F (37.1 C)   Ht 5\' 10"  (1.778 m)   Wt 149 lb (67.6 kg)   SpO2 98%   BMI 21.38 kg/m  General: no acute distress , A&Ox3   Commons side effects, risks, benefits, and alternatives for medications and treatment plan prescribed today were discussed, and the patient expressed understanding of the given instructions. Patient is instructed to call or message via MyChart if he/she has any questions or concerns regarding our treatment plan. No barriers to understanding were identified. We discussed Red Flag symptoms and signs in detail. Patient expressed understanding regarding what to do in case of urgent or emergency type symptoms.  Medication list was reconciled, printed and provided to the patient in AVS. Patient instructions and summary information was reviewed with the patient as documented in the AVS. This note was prepared with assistance of Dragon voice recognition software. Occasional wrong-word or sound-a-like substitutions may have occurred due to the inherent limitations of voice recognition software

## 2022-06-04 NOTE — Patient Instructions (Signed)
Please follow up if symptoms do not improve or as needed.   

## 2022-06-08 ENCOUNTER — Ambulatory Visit: Payer: Medicaid Other | Admitting: Internal Medicine

## 2022-06-09 ENCOUNTER — Ambulatory Visit (HOSPITAL_COMMUNITY)
Admission: EM | Admit: 2022-06-09 | Discharge: 2022-06-09 | Disposition: A | Discharge status: Home / Self Care | Payer: Managed Care, Other (non HMO) | Attending: Internal Medicine | Admitting: Internal Medicine

## 2022-06-09 ENCOUNTER — Encounter (HOSPITAL_COMMUNITY): Payer: Self-pay

## 2022-06-09 DIAGNOSIS — R369 Urethral discharge, unspecified: Secondary | ICD-10-CM

## 2022-06-09 DIAGNOSIS — R8290 Unspecified abnormal findings in urine: Secondary | ICD-10-CM

## 2022-06-09 DIAGNOSIS — L293 Anogenital pruritus, unspecified: Secondary | ICD-10-CM

## 2022-06-09 DIAGNOSIS — N341 Nonspecific urethritis: Secondary | ICD-10-CM

## 2022-06-09 LAB — POCT URINALYSIS DIP (MANUAL ENTRY)
Bilirubin, UA: NEGATIVE
Blood, UA: NEGATIVE
Glucose, UA: NEGATIVE mg/dL
Ketones, POC UA: NEGATIVE mg/dL
Leukocytes, UA: NEGATIVE
Nitrite, UA: NEGATIVE
Protein Ur, POC: NEGATIVE mg/dL
Spec Grav, UA: 1.02 (ref 1.010–1.025)
Urobilinogen, UA: 0.2 E.U./dL
pH, UA: 7 (ref 5.0–8.0)

## 2022-06-09 NOTE — ED Triage Notes (Signed)
Patient here today with c/o urge to urinate and itchy feeling inside before or after he urinates X 1 day. His girlfriend just got over a yeast infection a week ago. His PCP gave him Doxycycline for prevention so him and his girlfriend wouldn't pass it back and forth 2 weeks ago in which he completed. He stated that his girlfriend did not take her medication like she was supposed to.

## 2022-06-09 NOTE — Discharge Instructions (Addendum)
Please call the urologist first thing Monday morning to set up an appointment.  I believe you will benefit from seeing them given you are having frequent symptoms with several negative tests.  Make sure you increase your water intake to at least 64 ounces daily.  Avoid high sugar drinks as they can cause urinary discomfort   I recommend to abstain from intercourse until urology can evaluate you

## 2022-06-09 NOTE — ED Provider Notes (Signed)
[facility]    CSN: [id number] Arrival date & time: 06/09/22  1239      History   Chief Complaint Chief Complaint  Patient presents with   Urinary Frequency    HPI Todd Mcguire is a 27 y.o. male.  Here with 1 day history of itching before and after urination. No penile discharge or swelling. Denies fever, abd pian, NVD  He has been seen several times by PCP for urethritis in the last few months. Has been put on doxycycline twice in the last 2 months, last course finished last week His cytology swabs have been returning negative and had negative mycoplasma urine test  He was seen by a different PCP on 4/29, note reports reassurance was provided given his several negative tests and absence of symptoms  No recent intercourse   Past Medical History:  Diagnosis Date   Dizziness 05/22/2017   Heart murmur    Palpitations 05/22/2017   Penile discharge 04/09/2022   Had recent negative sexual transmitted infection screening tests  Girlfriend had bacterial vaginosis and yeast   Right sided abdominal pain 04/09/2022   Started early February 2024  Always on right  Crampy  Pain 4/10 at worst  Does not radiate  Does not seem to associated with penile discharge which started after  Associated with constipation.  Not associated with nausea, vomiting, diarrhea, ors systemic signs and symptoms such as fever, myalgias, malaise, fatigue, chills, headaches  History constipation as child   Associated with smoking heavy bl    Patient Active Problem List   Diagnosis Date Noted   Seasonal allergies 05/14/2022   Palpitations 05/22/2017   Dizziness 05/22/2017   Heart murmur     History reviewed. No pertinent surgical history.     Home Medications    Prior to Admission medications   Not on File    Family History Family History  Problem Relation Age of Onset   Healthy Mother    Hypertension Father    Diabetes Father    Healthy Father     Social History Social  History   Tobacco Use   Smoking status: Some Days    Types: Cigars   Smokeless tobacco: Never  Vaping Use   Vaping Use: Never used  Substance Use Topics   Alcohol use: Yes    Comment: occasionally   Drug use: Not Currently    Comment: tobacco     Allergies   Cephalexin, Flexeril [cyclobenzaprine], and Ibuprofen   Review of Systems Review of Systems As per HPI  Physical Exam Triage Vital Signs ED Triage Vitals [06/09/22 1340]  Enc Vitals Group     BP 136/87     Pulse Rate (!) 49     Resp 16     Temp 98 F (36.7 C)     Temp Source Oral     SpO2 98 %     Weight 146 lb (66.2 kg)     Height 5\' 10"  (1.778 m)     Head Circumference      Peak Flow      Pain Score 0     Pain Loc      Pain Edu?      Excl. in GC?    No data found.  Updated Vital Signs BP 136/87 (BP Location: Left Arm)   Pulse (!) 49   Temp 98 F (36.7 C) (Oral)   Resp 16   Ht 5\' 10"  (1.778 m)   Wt 146 lb (66.2  kg)   SpO2 98%   BMI 20.95 kg/m     Physical Exam Vitals and nursing note reviewed.  Constitutional:      General: He is not in acute distress.    Appearance: Normal appearance.  HENT:     Mouth/Throat:     Pharynx: Oropharynx is clear.  Cardiovascular:     Rate and Rhythm: Normal rate and regular rhythm.     Pulses: Normal pulses.     Heart sounds: Normal heart sounds.  Pulmonary:     Effort: Pulmonary effort is normal.     Breath sounds: Normal breath sounds.  Neurological:     Mental Status: He is alert and oriented to person, place, and time.     UC Treatments / Results  Labs (all labs ordered are listed, but only abnormal results are displayed) Labs Reviewed  POCT URINALYSIS DIP (MANUAL ENTRY)    EKG   Radiology No results found.  Procedures Procedures (including critical care time)  Medications Ordered in UC Medications - No data to display  Initial Impression / Assessment and Plan / UC Course  I have reviewed the triage vital signs and the nursing  notes.  Pertinent labs & imaging results that were available during my care of the patient were reviewed by me and considered in my medical decision making (see chart for details).  Urine is unremarkable  Cytology swab on 4/8 was negative. Do not see indication to repeat testing today given several negative swabs since February and only one partner, no new symptoms. He would benefit from seeing urology. Provided with clinic info. Recommend increase water intake and avoid intercourse until he can follow up Return precautions discussed. Patient agrees to plan  Final Clinical Impressions(s) / UC Diagnoses   Final diagnoses:  Itching of penis     Discharge Instructions      Please call the urologist first thing Monday morning to set up an appointment.  I believe you will benefit from seeing them given you are having frequent symptoms with several negative tests.  Make sure you increase your water intake to at least 64 ounces daily.  Avoid high sugar drinks as they can cause urinary discomfort   I recommend to abstain from intercourse until urology can evaluate you     ED Prescriptions   None    PDMP not reviewed this encounter.   Marlow Baars, [place] 06/09/22 1423

## 2022-06-12 ENCOUNTER — Ambulatory Visit: Payer: Medicaid Other | Admitting: Internal Medicine

## 2022-06-19 ENCOUNTER — Encounter: Payer: Self-pay | Admitting: Internal Medicine

## 2022-06-19 ENCOUNTER — Ambulatory Visit (INDEPENDENT_AMBULATORY_CARE_PROVIDER_SITE_OTHER): Payer: Managed Care, Other (non HMO) | Admitting: Internal Medicine

## 2022-06-19 ENCOUNTER — Other Ambulatory Visit (HOSPITAL_COMMUNITY)
Admission: RE | Admit: 2022-06-19 | Discharge: 2022-06-19 | Disposition: A | Discharge status: Home / Self Care | Payer: Managed Care, Other (non HMO) | Source: Ambulatory Visit | Attending: Internal Medicine | Admitting: Internal Medicine

## 2022-06-19 VITALS — BP 113/72 | HR 65 | Temp 98.4°F | Ht 70.0 in | Wt 145.2 lb

## 2022-06-19 DIAGNOSIS — R300 Dysuria: Secondary | ICD-10-CM

## 2022-06-19 DIAGNOSIS — R350 Frequency of micturition: Secondary | ICD-10-CM | POA: Diagnosis not present

## 2022-06-19 DIAGNOSIS — N342 Other urethritis: Secondary | ICD-10-CM | POA: Diagnosis present

## 2022-06-19 DIAGNOSIS — R809 Proteinuria, unspecified: Secondary | ICD-10-CM | POA: Insufficient documentation

## 2022-06-19 LAB — POCT URINALYSIS DIPSTICK
Blood, UA: NEGATIVE
Glucose, UA: NEGATIVE
Leukocytes, UA: NEGATIVE
Nitrite, UA: NEGATIVE
Protein, UA: POSITIVE — AB
Spec Grav, UA: 1.025 (ref 1.010–1.025)
Urobilinogen, UA: 1 E.U./dL
pH, UA: 6 (ref 5.0–8.0)

## 2022-06-19 MED ORDER — DOXYCYCLINE HYCLATE 100 MG PO TABS
100.0000 mg | ORAL_TABLET | Freq: Two times a day (BID) | ORAL | 0 refills | Status: DC
Start: 2022-06-19 — End: 2022-06-29

## 2022-06-19 MED ORDER — FLUCONAZOLE 150 MG PO TABS
150.0000 mg | ORAL_TABLET | Freq: Once | ORAL | 0 refills | Status: AC
Start: 2022-06-19 — End: 2022-06-19

## 2022-06-19 NOTE — Assessment & Plan Note (Signed)
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  Will start with just a Diflucan trial to kill any yeast that might be causing the problem and wait and see if that fixes it great.  Next will if that fails start on a long course of doxycycline with the assumption that may be there is an infection in his prostate that is recurring and he just needs a longer course.  If he is improving with this but he does not feel like it is fully eradicated at the end I will extend it he can just call. Also finally we will just recheck the urine and for sexual transmitted infections again today confirm again that this situation has not changed Also the information about possible allergens like from spermicides was provided to the patient to consider these options are deemed less likely explanations

## 2022-06-19 NOTE — Progress Notes (Signed)
[facility]: [phone]   Routine Medical Office Visit  Patient:  Todd Mcguire      Age: 27 y.o.       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  Will start with just a Diflucan trial to kill any yeast that might be causing the problem and wait and see if that fixes it great.  Next will if that fails start on a long course of doxycycline with the assumption that may be there is an infection in his prostate that is recurring and he just needs a longer course.  If he is improving with this but he does not feel like it is fully eradicated at the end I will extend it he can just call. Also finally we will just recheck the urine and for sexual transmitted infections again today confirm again that this situation has not changed Also the information about possible allergens like from spermicides was provided to the patient to consider these options are deemed less likely explanations  Orders: -     Urinalysis, Complete -     Cytology (oral, anal, urethral) ancillary only -     Fluconazole; Take 1 tablet (150 mg total) by mouth once for 1 dose.  Dispense: 1 tablet; Refill: 0 -     Doxycycline Hyclate; Take 1 tablet (100 mg total) by mouth 2 (two) times daily.  Dispense: 28 tablet; Refill: 0     Treatment plan discussed and reviewed in detail. Explained medication safety and potential side effects. Agreed on patient returning to office if symptoms worsen, persist, or new symptoms develop. Discussed precautions in case of needing to visit the Emergency  Department. Answered all patient questions and confirmed understanding and comfort with the plan. Encouraged patient to contact our office if they have any questions or concerns.      Clinical Presentation:     27 y.o. male here today for Urine frequency and Dysuria (Symptoms for about four days, before and after urinating. Has an appointment scheduled with Urology on 5/24.)   Dysuria  The current episode started in the past 7 days. The problem occurs every urination. The problem has been gradually improving. The quality of the pain is described as aching. The pain is at a severity of 4/10. The pain is mild. There has been no fever. He is Sexually active. There is No history of pyelonephritis. Associated symptoms include frequency. Pertinent negatives include no chills, discharge, flank pain, hematuria, hesitancy, nausea, sweats, urgency or vomiting. He has tried increased fluids for the symptoms. The treatment provided mild relief. There is no history of catheterization, kidney stones, recurrent UTIs, a single kidney, urinary stasis or a urological procedure.    We have set up an urinalyses in the last 3 to 4 months due to urinary tract symptoms that have always been negative except for a couple times there was a little bit of protein in it. We have several test for sexual transmitted infection and these have all been negative.  However he has had urethritis type symptoms and they  seem to improve with a course of doxycycline.  It hurts to pee but does not really burn he is in a monogamous heterosexual relationship and the girlfriend had a yeast infection He has been off antibiotics for this most recent episode He wonders if he might have a yeast infection A couple months ago we looked for kidney stones and had a negative CT renal stone protocol due to recurring urethritis symptoms   He has Palpitations; Dizziness; Heart murmur; and Seasonal allergies on their problem list. He  has a past medical  history of Dizziness (05/22/2017), Heart murmur, Palpitations (05/22/2017), Penile discharge (04/09/2022), and Right sided abdominal pain (04/09/2022).  height is 5\' 10"  (1.778 m) and weight is 145 lb 3.2 oz (65.9 kg). His temporal temperature is 98.4 F (36.9 C). His blood pressure is 113/72 and his pulse is 65. His oxygen saturation is 98%.   Medications:    Na 142  K 3.5  Cl 104  CO2 29  Glucose 5  Creat:0.91  Ca 9.4  WBC 7.0  HGB 15.9  HCT 48.0  MCV 84.7  Plt 211  5 or 6 urinalysis have all been negative except for some small amount of protein on some of them and multiple tests for sexually transmitted infections have been negative     No outpatient medications prior to visit.   No facility-administered medications prior to visit.         Clinical Data Analysis:    Physical Exam  BP 113/72 (BP Location: Left Arm, Patient Position: Sitting)   Pulse 65   Temp 98.4 F (36.9 C) (Temporal)   Ht 5\' 10"  (1.778 m)   Wt 145 lb 3.2 oz (65.9 kg)   SpO2 98%   BMI 20.83 kg/m  Wt Readings from Last 10 Encounters:  06/19/22 145 lb 3.2 oz (65.9 kg)  06/09/22 146 lb (66.2 kg)  06/04/22 149 lb (67.6 kg)  05/14/22 148 lb 12.8 oz (67.5 kg)  04/09/22 147 lb 3.2 oz (66.8 kg)  03/30/22 158 lb 11.7 oz (72 kg)  03/06/22 160 lb (72.6 kg)  01/12/22 160 lb (72.6 kg)  06/01/21 158 lb (71.7 kg)  02/16/21 150 lb 12.7 oz (68.4 kg)   Vital signs reviewed.  Nursing notes reviewed. Weight trend reviewed. Abnormalities and Problem-Specific physical exam findings:  no suprapubic tenderness or abdominal tenderness or distension.  Penis not examined- patient reports nothing abnormal there except the discomfort when urinating General Appearance:  No acute distress appreciable.   Well-groomed, healthy-appearing male.  Well proportioned with no abnormal fat distribution.  Good muscle tone. Skin: Clear and well-hydrated. Pulmonary:  Normal work of breathing at rest, no respiratory distress apparent.  SpO2: 98 %  Musculoskeletal: All extremities are intact.  Neurological:  Awake, alert, oriented, and engaged.  No obvious focal neurological deficits or cognitive impairments.  Sensorium seems unclouded.   Speech is clear and coherent with logical content. Psychiatric:  Appropriate mood, pleasant and cooperative demeanor, thoughtful and engaged during the exam   Additional Results Reviewed:     Results for orders placed or performed in visit on 06/19/22  POCT Urinalysis Dipstick  Result Value Ref Range   Color, UA dark yellow    Clarity, UA clear    Glucose, UA Negative Negative   Bilirubin, UA 1+    Ketones, UA 2+    Spec Grav, UA 1.025 1.010 - 1.025   Blood, UA Negative    pH, UA 6.0 5.0 - 8.0   Protein,  UA Positive (A) Negative   Urobilinogen, UA 1.0 0.2 or 1.0 E.U./dL   Nitrite, UA Negative    Leukocytes, UA Negative Negative   Appearance     Odor      Recent Results (from the past 2160 hour(s))  POCT urinalysis dipstick     Status: Abnormal   Collection Time: 03/26/22 11:54 AM  Result Value Ref Range   Color, UA yellow yellow   Clarity, UA clear clear   Glucose, UA negative negative mg/dL   Bilirubin, UA negative negative   Ketones, POC UA negative negative mg/dL   Spec Grav, UA 1.610 9.604 - 1.025   Blood, UA negative negative   pH, UA 7.5 5.0 - 8.0   Protein Ur, POC trace (A) negative mg/dL   Urobilinogen, UA 0.2 0.2 or 1.0 E.U./dL   Nitrite, UA Negative Negative   Leukocytes, UA Negative Negative  Cytology (oral, anal, urethral) ancillary only     Status: None   Collection Time: 03/26/22 12:19 PM  Result Value Ref Range   Neisseria Gonorrhea Negative    Chlamydia Negative    Trichomonas Negative    Comment Normal Reference Ranger Chlamydia - Negative    Comment      Normal Reference Range Neisseria Gonorrhea - Negative   Comment Normal Reference Range Trichomonas - Negative   Urinalysis, Routine w reflex microscopic -Urine, Clean Catch     Status:  Abnormal   Collection Time: 03/30/22 10:05 AM  Result Value Ref Range   Color, Urine YELLOW YELLOW   APPearance CLEAR CLEAR   Specific Gravity, Urine 1.034 (H) 1.005 - 1.030   pH 6.5 5.0 - 8.0   Glucose, UA NEGATIVE NEGATIVE mg/dL   Hgb urine dipstick NEGATIVE NEGATIVE   Bilirubin Urine NEGATIVE NEGATIVE   Ketones, ur NEGATIVE NEGATIVE mg/dL   Protein, ur 30 (A) NEGATIVE mg/dL   Nitrite NEGATIVE NEGATIVE   Leukocytes,Ua NEGATIVE NEGATIVE   RBC / HPF 0-5 0 - 5 RBC/hpf   WBC, UA 0-5 0 - 5 WBC/hpf   Bacteria, UA NONE SEEN NONE SEEN   Squamous Epithelial / HPF 0-5 0 - 5 /HPF   Mucus PRESENT     Comment: Performed at [facility], [address] [place], [place] [address]  RPR     Status: None   Collection Time: 03/30/22 10:05 AM  Result Value Ref Range   RPR Ser Ql NON REACTIVE NON REACTIVE    Comment: Performed at [facility], [address] [place], [place] [address]  HIV Antibody (routine testing w rflx)     Status: None   Collection Time: 03/30/22 10:05 AM  Result Value Ref Range   HIV Screen 4th Generation wRfx Non Reactive Non Reactive    Comment: Performed at [facility], [address], [place], [place] [address]  Cytology (oral, anal, urethral) ancillary only     Status: None   Collection Time: 04/09/22 10:11 AM  Result Value Ref Range   Neisseria Gonorrhea Negative    Chlamydia Negative    Trichomonas Negative    Comment Normal Reference Ranger Chlamydia - Negative    Comment      Normal Reference Range Neisseria Gonorrhea - Negative   Comment Normal Reference Range Trichomonas - Negative   Mycoplasma / ureaplasma culture     Status: None   Collection Time: 04/09/22 10:16 AM   Specimen: Genital  Result Value Ref Range   Specimen Source (CALR) GENITAL  STATUS FINAL    MYCOPLASMA HOMINIS: NOT ISOLATED    UREAPLASMA SPECIES: NOT ISOLATED     Comment: REFERENCE RANGE: NOT ISOLATED  POCT Urinalysis Dipstick     Status: Abnormal    Collection Time: 05/14/22  2:43 PM  Result Value Ref Range   Color, UA yellow    Clarity, UA clear    Glucose, UA Negative Negative   Bilirubin, UA Negative    Ketones, UA 5    Spec Grav, UA 1.015 1.010 - 1.025   Blood, UA Negative    pH, UA 7.0 5.0 - 8.0   Protein, UA Positive (A) Negative   Urobilinogen, UA 0.2 0.2 or 1.0 E.U./dL   Nitrite, UA Negative    Leukocytes, UA Negative Negative   Appearance     Odor    Urine cytology ancillary only     Status: None   Collection Time: 05/14/22  3:07 PM  Result Value Ref Range   Neisseria Gonorrhea Negative    Chlamydia Negative    Trichomonas Negative    Comment Normal Reference Range Trichomonas - Negative    Comment Normal Reference Ranger Chlamydia - Negative    Comment      Normal Reference Range Neisseria Gonorrhea - Negative  POCT urinalysis dipstick     Status: None   Collection Time: 06/09/22  1:51 PM  Result Value Ref Range   Color, UA yellow yellow   Clarity, UA clear clear   Glucose, UA negative negative mg/dL   Bilirubin, UA negative negative   Ketones, POC UA negative negative mg/dL   Spec Grav, UA 1.610 9.604 - 1.025   Blood, UA negative negative   pH, UA 7.0 5.0 - 8.0   Protein Ur, POC negative negative mg/dL   Urobilinogen, UA 0.2 0.2 or 1.0 E.U./dL   Nitrite, UA Negative Negative   Leukocytes, UA Negative Negative  POCT Urinalysis Dipstick     Status: Abnormal   Collection Time: 06/19/22 11:37 AM  Result Value Ref Range   Color, UA dark yellow    Clarity, UA clear    Glucose, UA Negative Negative   Bilirubin, UA 1+    Ketones, UA 2+    Spec Grav, UA 1.025 1.010 - 1.025   Blood, UA Negative    pH, UA 6.0 5.0 - 8.0   Protein, UA Positive (A) Negative   Urobilinogen, UA 1.0 0.2 or 1.0 E.U./dL   Nitrite, UA Negative    Leukocytes, UA Negative Negative   Appearance     Odor      No image results found.   No results found.     Signed: Lula Olszewski, MD 06/19/2022 8:09 PM

## 2022-06-20 ENCOUNTER — Telehealth: Payer: Self-pay | Admitting: Internal Medicine

## 2022-06-20 ENCOUNTER — Encounter: Payer: Self-pay | Admitting: Internal Medicine

## 2022-06-20 LAB — URINALYSIS, COMPLETE
Bacteria, UA: NONE SEEN /HPF
Bilirubin Urine: NEGATIVE
Glucose, UA: NEGATIVE
Hgb urine dipstick: NEGATIVE
Nitrite: NEGATIVE
RBC / HPF: NONE SEEN /HPF (ref 0–2)
Specific Gravity, Urine: 1.033 (ref 1.001–1.035)
Squamous Epithelial / HPF: NONE SEEN /HPF (ref <=5)
pH: 6.5 (ref 5.0–8.0)

## 2022-06-20 NOTE — Progress Notes (Signed)
Please notify lab to culture tis I placed order reflex already

## 2022-06-20 NOTE — Telephone Encounter (Signed)
Pt also need to know which meds to take first, please advise

## 2022-06-20 NOTE — Addendum Note (Signed)
Addended by: Lula Olszewski on: 06/20/2022 08:09 AM   Modules accepted: Orders

## 2022-06-20 NOTE — Telephone Encounter (Signed)
Patient requests to be called to discuss Lab results 

## 2022-06-21 ENCOUNTER — Other Ambulatory Visit: Payer: Managed Care, Other (non HMO)

## 2022-06-21 LAB — CYTOLOGY, (ORAL, ANAL, URETHRAL) ANCILLARY ONLY
Chlamydia: NEGATIVE
Comment: NEGATIVE
Comment: NEGATIVE
Comment: NORMAL
Neisseria Gonorrhea: NEGATIVE
Trichomonas: NEGATIVE

## 2022-06-21 NOTE — Telephone Encounter (Signed)
Patient is aware to come back to the office to leave an urine sample so we can send a culture

## 2022-06-22 LAB — URINE CULTURE
MICRO NUMBER:: 14965896
Result:: NO GROWTH
SPECIMEN QUALITY:: ADEQUATE

## 2022-06-29 ENCOUNTER — Ambulatory Visit (INDEPENDENT_AMBULATORY_CARE_PROVIDER_SITE_OTHER): Payer: Managed Care, Other (non HMO) | Admitting: Urology

## 2022-06-29 ENCOUNTER — Encounter: Payer: Self-pay | Admitting: Urology

## 2022-06-29 VITALS — BP 134/76 | HR 52 | Ht 70.0 in | Wt 148.0 lb

## 2022-06-29 DIAGNOSIS — R300 Dysuria: Secondary | ICD-10-CM | POA: Diagnosis not present

## 2022-06-29 DIAGNOSIS — N419 Inflammatory disease of prostate, unspecified: Secondary | ICD-10-CM

## 2022-06-29 DIAGNOSIS — N369 Urethral disorder, unspecified: Secondary | ICD-10-CM | POA: Diagnosis not present

## 2022-06-29 LAB — URINALYSIS, ROUTINE W REFLEX MICROSCOPIC
Bilirubin, UA: NEGATIVE
Glucose, UA: NEGATIVE
Ketones, UA: NEGATIVE
Leukocytes,UA: NEGATIVE
Nitrite, UA: NEGATIVE
Protein,UA: NEGATIVE
RBC, UA: NEGATIVE
Specific Gravity, UA: 1.025 (ref 1.005–1.030)
Urobilinogen, Ur: 0.2 mg/dL (ref 0.2–1.0)
pH, UA: 7.5 (ref 5.0–7.5)

## 2022-06-29 MED ORDER — DOXYCYCLINE HYCLATE 100 MG PO TABS: 100.0000 mg | ORAL_TABLET | Freq: Two times a day (BID) | ORAL | 0 refills | Status: DC

## 2022-06-29 NOTE — Progress Notes (Signed)
Assessment: 1. Prostatitis, unspecified prostatitis type   2. Dysuria     Plan: I personally reviewed the patient's chart including provider notes, lab and imaging results. His symptoms are consistent with possible prostatitis.  He is currently doing well on doxycycline. Recommend that he complete a full 21-day course of the doxycycline for treatment. Return to office as needed. If his symptoms continue after doxycycline, consider course of azithromycin.   Chief Complaint:  Chief Complaint  Patient presents with   Dysuria    History of Present Illness:  Todd Mcguire is a 27 y.o. male who is seen in consultation from Lula Olszewski, MD for evaluation of dysuria and urethral discharge.  He has had symptoms for approximately 3 months.  He reports that his girlfriend had bacterial vaginitis and a yeast infection.  He has noted occasional urethral discharge, primarily in the morning as well as dysuria.  He has had multiple STD tests which have all been negative.  He was evaluated with a CT scan on 03/14/2022 for abdominal and flank pain which showed no renal or ureteral calculi and no evidence of obstruction.  He is currently on a 2-week course of doxycycline.  He reports that the dysuria and discharge have resolved.  No gross hematuria.  No history of UTIs or STDs. IPSS = 0 today   Past Medical History:  Past Medical History:  Diagnosis Date   Dizziness 05/22/2017   Heart murmur    Palpitations 05/22/2017   Penile discharge 04/09/2022   Had recent negative sexual transmitted infection screening tests  Girlfriend had bacterial vaginosis and yeast   Right sided abdominal pain 04/09/2022   Started early February 2024  Always on right  Crampy  Pain 4/10 at worst  Does not radiate  Does not seem to associated with penile discharge which started after  Associated with constipation.  Not associated with nausea, vomiting, diarrhea, ors systemic signs and symptoms such as fever, myalgias,  malaise, fatigue, chills, headaches  History constipation as child   Associated with smoking heavy bl    Past Surgical History:  No past surgical history on file.  Allergies:  Allergies  Allergen Reactions   Cephalexin Nausea And Vomiting    Not an allergic reaction   Flexeril [Cyclobenzaprine] Other (See Comments)    Did not like the way it made him feel   Ibuprofen Other (See Comments)    Stomach pain at 600 mg dose; can take smaller doses    Family History:  Family History  Problem Relation Age of Onset   Healthy Mother    Hypertension Father    Diabetes Father    Healthy Father     Social History:  Social History   Tobacco Use   Smoking status: Some Days    Types: Cigars   Smokeless tobacco: Never  Vaping Use   Vaping Use: Never used  Substance Use Topics   Alcohol use: Yes    Comment: occasionally   Drug use: Not Currently    Comment: tobacco    Review of symptoms:  Constitutional:  Negative for unexplained weight loss, night sweats, fever, chills ENT:  Negative for nose bleeds, sinus pain, painful swallowing CV:  Negative for chest pain, shortness of breath, exercise intolerance, palpitations, loss of consciousness Resp:  Negative for cough, wheezing, shortness of breath GI:  Negative for nausea, vomiting, diarrhea, bloody stools GU:  Positives noted in HPI; otherwise negative for gross hematuria, urinary incontinence Neuro:  Negative for seizures,  poor balance, limb weakness, slurred speech Psych:  Negative for lack of energy, depression, anxiety Endocrine:  Negative for polydipsia, polyuria, symptoms of hypoglycemia (dizziness, hunger, sweating) Hematologic:  Negative for anemia, purpura, petechia, prolonged or excessive bleeding, use of anticoagulants  Allergic:  Negative for difficulty breathing or choking as a result of exposure to anything; no shellfish allergy; no allergic response (rash/itch) to materials, foods  Physical exam: BP 134/76   Pulse  (!) 52   Ht 5\' 10"  (1.778 m)   Wt 148 lb (67.1 kg)   BMI 21.24 kg/m  GENERAL APPEARANCE:  Well appearing, well developed, well nourished, NAD HEENT:  Atraumatic, normocephalic, oropharynx clear NECK:  Supple without lymphadenopathy or thyromegaly ABDOMEN:  Soft, non-tender, no masses EXTREMITIES:  Moves all extremities well, without clubbing, cyanosis, or edema NEUROLOGIC:  Alert and oriented x 3, normal gait, CN II-XII grossly intact MENTAL STATUS:  appropriate BACK:  Non-tender to palpation, No CVAT SKIN:  Warm, dry, and intact GU: Penis:  circumcised Meatus: normal, no discharge noted Scrotum: normal, no masses Testis: normal without masses bilateral Epididymis: normal  Results: U/A negative

## 2022-07-12 ENCOUNTER — Ambulatory Visit: Payer: Medicaid Other | Admitting: Allergy & Immunology

## 2022-07-24 ENCOUNTER — Other Ambulatory Visit: Payer: Self-pay | Admitting: Urology

## 2022-07-24 DIAGNOSIS — N419 Inflammatory disease of prostate, unspecified: Secondary | ICD-10-CM

## 2022-07-24 MED ORDER — DOXYCYCLINE HYCLATE 100 MG PO TABS: 100.0000 mg | ORAL_TABLET | Freq: Two times a day (BID) | ORAL | 0 refills | Status: DC

## 2022-07-30 ENCOUNTER — Ambulatory Visit
Admission: RE | Admit: 2022-07-30 | Discharge: 2022-07-30 | Disposition: A | Discharge status: Home / Self Care | Payer: Managed Care, Other (non HMO) | Source: Ambulatory Visit | Attending: Nurse Practitioner | Admitting: Nurse Practitioner

## 2022-07-30 ENCOUNTER — Other Ambulatory Visit: Payer: Self-pay | Admitting: Urology

## 2022-07-30 VITALS — BP 117/74 | HR 57 | Temp 99.0°F | Resp 16

## 2022-07-30 DIAGNOSIS — Z113 Encounter for screening for infections with a predominantly sexual mode of transmission: Secondary | ICD-10-CM | POA: Diagnosis present

## 2022-07-30 DIAGNOSIS — N419 Inflammatory disease of prostate, unspecified: Secondary | ICD-10-CM

## 2022-07-30 DIAGNOSIS — R300 Dysuria: Secondary | ICD-10-CM | POA: Insufficient documentation

## 2022-07-30 DIAGNOSIS — N3000 Acute cystitis without hematuria: Secondary | ICD-10-CM | POA: Insufficient documentation

## 2022-07-30 LAB — POCT URINALYSIS DIP (MANUAL ENTRY)
Bilirubin, UA: NEGATIVE
Blood, UA: NEGATIVE
Glucose, UA: NEGATIVE mg/dL
Ketones, POC UA: NEGATIVE mg/dL
Nitrite, UA: NEGATIVE
Protein Ur, POC: NEGATIVE mg/dL
Spec Grav, UA: 1.02 (ref 1.010–1.025)
Urobilinogen, UA: 0.2 E.U./dL
pH, UA: 7 (ref 5.0–8.0)

## 2022-07-30 MED ORDER — DOXYCYCLINE HYCLATE 100 MG PO CAPS
100.0000 mg | ORAL_CAPSULE | Freq: Two times a day (BID) | ORAL | 0 refills | Status: AC
Start: 2022-07-30 — End: 2022-08-06

## 2022-07-30 NOTE — ED Triage Notes (Signed)
Pt presents for STD and Uti testing.   Pt states he has stinging feeling when urinating and reports rt side pain X 1 week.

## 2022-07-30 NOTE — Discharge Instructions (Signed)
Start Doxycycline twice daily for 7 days. The clinic will contact you with the results of the urine culture and STD testing done today.  Please follow-up with your urologist if your symptoms persist.  Please go to the ER for any worsening symptoms.

## 2022-07-30 NOTE — ED Provider Notes (Signed)
[facility]    CSN: [id number] Arrival date & time: 07/30/22  1143      History   Chief Complaint Chief Complaint  Patient presents with   Urinary Frequency    Entered by patient   SEXUALLY TRANSMITTED DISEASE    HPI Todd Mcguire is a 27 y.o. male presents for evaluation of dysuria.  Patient reports 1 week of urinary burning with some frequency.  Denies urgency, hematuria, fevers, nausea/vomiting, flank pain.  No penile discharge or testicular pain or swelling.  No difficulty starting or stopping his urine stream.  No STD exposure or concern.  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  He was last on doxycycline in mid May.  Chart review does show that he contact his urologist on June 18 requesting a refill of his doxycycline which was sent to his pharmacy.  Patient states he did not pick it up or started.  He did send another request to his urologist today but has not heard back.  No other concerns at this time.   Urinary Frequency    Past Medical History:  Diagnosis Date   Dizziness 05/22/2017   Heart murmur    Palpitations 05/22/2017   Penile discharge 04/09/2022   Had recent negative sexual transmitted infection screening tests  Girlfriend had bacterial vaginosis and yeast   Right sided abdominal pain 04/09/2022   Started early February 2024  Always on right  Crampy  Pain 4/10 at worst  Does not radiate  Does not seem to associated with penile discharge which started after  Associated with constipation.  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 No pertinent surgical  history.     Home Medications    Prior to Admission medications   Medication Sig Start Date End Date Taking? Authorizing Provider  doxycycline (VIBRAMYCIN) 100 MG capsule Take 1 capsule (100 mg total) by mouth 2 (two) times daily for 7 days. 07/30/22 08/06/22 Yes Radford Pax, NP    Family History Family History  Problem Relation Age of Onset   Healthy Mother    Hypertension Father    Diabetes Father    Healthy Father     Social History Social History   Tobacco Use   Smoking status: Some Days    Types: Cigars   Smokeless tobacco: Never  Vaping Use   Vaping Use: Never used  Substance Use Topics   Alcohol use: Yes    Comment: occasionally   Drug use: Not Currently    Comment: tobacco     Allergies   Cephalexin, Flexeril [cyclobenzaprine], and Ibuprofen   Review of Systems Review of Systems  Genitourinary:  Positive for dysuria and frequency.     Physical Exam Triage Vital Signs ED Triage Vitals [07/30/22 1157]  Enc Vitals Group     BP 117/74     Pulse Rate (!) 57     Resp 16     Temp 99 F (37.2 C)     Temp Source Oral     SpO2 96 %     Weight      Height  Head Circumference      Peak Flow      Pain Score 4     Pain Loc      Pain Edu?      Excl. in GC?    No data found.  Updated Vital Signs BP 117/74 (BP Location: Right Arm)   Pulse (!) 57   Temp 99 F (37.2 C) (Oral)   Resp 16   SpO2 96%   Visual Acuity Right Eye Distance:   Left Eye Distance:   Bilateral Distance:    Right Eye Near:   Left Eye Near:    Bilateral Near:     Physical Exam Vitals and nursing note reviewed.  Constitutional:      General: He is not in acute distress.    Appearance: Normal appearance. He is not ill-appearing.  HENT:     Head: Normocephalic and atraumatic.  Eyes:     Pupils: Pupils are equal, round, and reactive to light.  Cardiovascular:     Rate and Rhythm: Normal rate.  Pulmonary:     Effort: Pulmonary effort is normal.  Abdominal:      Tenderness: There is no right CVA tenderness or left CVA tenderness.  Skin:    General: Skin is warm and dry.  Neurological:     General: No focal deficit present.     Mental Status: He is alert and oriented to person, place, and time.  Psychiatric:        Mood and Affect: Mood normal.        Behavior: Behavior normal.      UC Treatments / Results  Labs (all labs ordered are listed, but only abnormal results are displayed) Labs Reviewed  POCT URINALYSIS DIP (MANUAL ENTRY) - Abnormal; Notable for the following components:      Result Value   Leukocytes, UA Trace (*)    All other components within normal limits  URINE CULTURE  CYTOLOGY, (ORAL, ANAL, URETHRAL) ANCILLARY ONLY    EKG   Radiology No results found.  Procedures Procedures (including critical care time)  Medications Ordered in UC Medications - No data to display  Initial Impression / Assessment and Plan / UC Course  I have reviewed the triage vital signs and the nursing notes.  Pertinent labs & imaging results that were available during my care of the patient were reviewed by me and considered in my medical decision making (see chart for details).     Reviewed exam and symptoms with patient.  No red flags.  UA with trace leuks, will culture.  STD testing is ordered and will contact with results.  Will start doxycycline as patient reports this is successfully treated his symptoms in the past.  Advised to follow-up with urology if symptoms persist.  ER precautions reviewed and patient verbalized understanding. Final Clinical Impressions(s) / UC Diagnoses   Final diagnoses:  Acute cystitis without hematuria  Dysuria  Screening examination for STD (sexually transmitted disease)     Discharge Instructions      Start Doxycycline twice daily for 7 days. The clinic will contact you with the results of the urine culture and STD testing done today.  Please follow-up with your urologist if your symptoms persist.   Please go to the ER for any worsening symptoms.   ED Prescriptions     Medication Sig Dispense Auth. Provider   doxycycline (VIBRAMYCIN) 100 MG capsule Take 1 capsule (100 mg total) by mouth 2 (two) times daily for 7 days. 14 capsule  Radford Pax, NP      PDMP not reviewed this encounter.   Radford Pax, NP 07/30/22 1231

## 2022-07-31 LAB — CYTOLOGY, (ORAL, ANAL, URETHRAL) ANCILLARY ONLY
Chlamydia: NEGATIVE
Comment: NEGATIVE
Comment: NEGATIVE
Comment: NORMAL
Neisseria Gonorrhea: NEGATIVE
Trichomonas: NEGATIVE

## 2022-08-01 ENCOUNTER — Telehealth (HOSPITAL_COMMUNITY): Payer: Self-pay | Admitting: Emergency Medicine

## 2022-08-01 LAB — URINE CULTURE: Culture: NO GROWTH

## 2022-08-01 NOTE — Telephone Encounter (Signed)
Per protocol, patient to d/c antibiotics.  Reviewed with patient, he does not believe he got a good swab for his STD result and would like to return to have the provider swab him.

## 2022-08-03 ENCOUNTER — Ambulatory Visit: Payer: Managed Care, Other (non HMO) | Admitting: Internal Medicine

## 2022-12-11 ENCOUNTER — Ambulatory Visit (HOSPITAL_COMMUNITY)
Admission: EM | Admit: 2022-12-11 | Discharge: 2022-12-11 | Disposition: A | Discharge status: Home / Self Care | Payer: Self-pay | Attending: Physician Assistant | Admitting: Physician Assistant

## 2022-12-11 ENCOUNTER — Encounter (HOSPITAL_COMMUNITY): Payer: Self-pay | Admitting: *Deleted

## 2022-12-11 DIAGNOSIS — R300 Dysuria: Secondary | ICD-10-CM | POA: Insufficient documentation

## 2022-12-11 LAB — POCT URINALYSIS DIP (MANUAL ENTRY)
Bilirubin, UA: NEGATIVE
Blood, UA: NEGATIVE
Glucose, UA: NEGATIVE mg/dL
Ketones, POC UA: NEGATIVE mg/dL
Leukocytes, UA: NEGATIVE
Nitrite, UA: NEGATIVE
Protein Ur, POC: 30 mg/dL — AB
Spec Grav, UA: 1.02 (ref 1.010–1.025)
Urobilinogen, UA: 1 U/dL
pH, UA: 7.5 (ref 5.0–8.0)

## 2022-12-11 NOTE — ED Provider Notes (Signed)
[facility]    CSN: [id number] Arrival date & time: 12/11/22  1034      History   Chief Complaint Chief Complaint  Patient presents with   Penile Discharge    HPI Todd Mcguire is a 27 y.o. male.   Complains of burning when he urinates.  Patient denies any penile discharge.  Patient reports that does not believe he has any risk of anything sexually transmitted but would like to be tested.  Patient denies any fever chills nausea or vomiting.  Denies any abdominal pain.  He has had similar symptoms in the past.  He denies any history of urinary tract infections   Penile Discharge    Past Medical History:  Diagnosis Date   Dizziness 05/22/2017   Heart murmur    Palpitations 05/22/2017   Penile discharge 04/09/2022   Had recent negative sexual transmitted infection screening tests  Girlfriend had bacterial vaginosis and yeast   Right sided abdominal pain 04/09/2022   Started early February 2024  Always on right  Crampy  Pain 4/10 at worst  Does not radiate  Does not seem to associated with penile discharge which started after  Associated with constipation.  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 No pertinent surgical history.     Home Medications    Prior to Admission medications   Not on File    Family History Family History  Problem Relation Age of Onset   Healthy Mother    Hypertension Father    Diabetes Father    Healthy Father     Social History Social History   Tobacco Use   Smoking status: Some Days    Types: Cigars   Smokeless tobacco: Never  Vaping Use   Vaping status: Never Used  Substance  Use Topics   Alcohol use: Yes    Comment: occasionally   Drug use: Not Currently     Allergies   Cephalexin, Flexeril [cyclobenzaprine], and Ibuprofen   Review of Systems Review of Systems  Genitourinary:  Positive for penile discharge.  All other systems reviewed and are negative.    Physical Exam Triage Vital Signs ED Triage Vitals  Encounter Vitals Group     BP 12/11/22 1118 114/70     Systolic BP Percentile --      Diastolic BP Percentile --      Pulse Rate 12/11/22 1118 (!) 59     Resp 12/11/22 1118 16     Temp 12/11/22 1118 98 F (36.7 C)     Temp Source 12/11/22 1118 Oral     SpO2 12/11/22 1118 97 %     Weight --      Height --      Head Circumference --      Peak Flow --      Pain Score 12/11/22 1116 0     Pain Loc --      Pain Education --      Exclude from Growth Chart --    No data found.  Updated Vital Signs BP 114/70 (BP Location: Right Arm)   Pulse (!) 59   Temp 98 F (36.7 C) (  Oral)   Resp 16   SpO2 97%   Visual Acuity Right Eye Distance:   Left Eye Distance:   Bilateral Distance:    Right Eye Near:   Left Eye Near:    Bilateral Near:     Physical Exam Vitals and nursing note reviewed.  Constitutional:      Appearance: He is well-developed.  HENT:     Head: Normocephalic.  Cardiovascular:     Rate and Rhythm: Normal rate.  Pulmonary:     Effort: Pulmonary effort is normal.  Abdominal:     General: There is no distension.  Skin:    General: Skin is warm.  Neurological:     General: No focal deficit present.     Mental Status: He is alert and oriented to person, place, and time.      UC Treatments / Results  Labs (all labs ordered are listed, but only abnormal results are displayed) Labs Reviewed  POCT URINALYSIS DIP (MANUAL ENTRY) - Abnormal; Notable for the following components:      Result Value   Color, UA straw (*)    Clarity, UA hazy (*)    Protein Ur, POC =30 (*)    All other components within normal limits   CYTOLOGY, (ORAL, ANAL, URETHRAL) ANCILLARY ONLY    EKG   Radiology No results found.  Procedures Procedures (including critical care time)  Medications Ordered in UC Medications - No data to display  Initial Impression / Assessment and Plan / UC Course  I have reviewed the triage vital signs and the nursing notes.  Pertinent labs & imaging results that were available during my care of the patient were reviewed by me and considered in my medical decision making (see chart for details).     UA is negative.  Test for GC chlamydia and trichomonas are pending. Final Clinical Impressions(s) / UC Diagnoses   Final diagnoses:  Dysuria     Discharge Instructions      YOu have test pending for std's.  Your urine does not show any sign of urinary tract infection   ED Prescriptions   None    PDMP not reviewed this encounter. An After Visit Summary was printed and given to the patient.          Elson Areas, [place] 12/11/22 1252

## 2022-12-11 NOTE — Discharge Instructions (Signed)
YOu have test pending for std's.  Your urine does not show any sign of urinary tract infection

## 2022-12-11 NOTE — ED Triage Notes (Signed)
Pt states he has been having yellow discharge from his penis X 2 days, but claims he hasn't had sexual intercourse in 2 months.

## 2022-12-12 LAB — CYTOLOGY, (ORAL, ANAL, URETHRAL) ANCILLARY ONLY
Chlamydia: NEGATIVE
Comment: NEGATIVE
Comment: NEGATIVE
Comment: NORMAL
Neisseria Gonorrhea: NEGATIVE
Trichomonas: NEGATIVE

## 2023-02-07 ENCOUNTER — Encounter (HOSPITAL_BASED_OUTPATIENT_CLINIC_OR_DEPARTMENT_OTHER): Payer: Self-pay | Admitting: Emergency Medicine

## 2023-02-07 ENCOUNTER — Other Ambulatory Visit: Payer: Self-pay

## 2023-02-07 ENCOUNTER — Emergency Department (HOSPITAL_BASED_OUTPATIENT_CLINIC_OR_DEPARTMENT_OTHER): Payer: Self-pay | Admitting: Radiology

## 2023-02-07 ENCOUNTER — Emergency Department (HOSPITAL_BASED_OUTPATIENT_CLINIC_OR_DEPARTMENT_OTHER)
Admission: EM | Admit: 2023-02-07 | Discharge: 2023-02-07 | Disposition: A | Discharge status: Home / Self Care | Payer: Self-pay | Attending: Emergency Medicine | Admitting: Emergency Medicine

## 2023-02-07 DIAGNOSIS — M79645 Pain in left finger(s): Secondary | ICD-10-CM | POA: Insufficient documentation

## 2023-02-07 NOTE — ED Provider Notes (Signed)
 [place] EM[facility]GENCY DEPARTMENT AT [facility] Provider Note   CSN: [id number] Arrival date & time: 02/07/23  1312     History  Chief Complaint  Patient presents with   Finger Injury    Todd Mcguire is a 28 y.o. male.  HPI   28 year old male presents emergency department complaints of left finger injury.  States he was playing some basketball 3 weeks ago and jammed his left index finger.  Patient is left-handed.  States that since then, has had pain with any movement of his finger.  Reports inability to straighten out his finger in its entirety.  Denies trauma elsewhere.  Has seen no one regarding injury.  Denies any weakness or sensory deficits in affected digit.  No significant pertinent past medical history  Home Medications Prior to Admission medications   Not on File      Allergies    Cephalexin, Flexeril  [cyclobenzaprine ], and Ibuprofen     Review of Systems   Review of Systems  All other systems reviewed and are negative.   Physical Exam Updated Vital Signs BP 136/74 (BP Location: Right Arm)   Pulse (!) 57   Temp 98.4 F (36.9 C)   Resp 16   Ht 5' 10 (1.778 m)   Wt 68 kg   SpO2 100%   BMI 21.52 kg/m  Physical Exam Vitals and nursing note reviewed.  Constitutional:      General: He is not in acute distress.    Appearance: He is well-developed.  HENT:     Head: Normocephalic and atraumatic.  Eyes:     Conjunctiva/sclera: Conjunctivae normal.  Cardiovascular:     Rate and Rhythm: Normal rate and regular rhythm.     Heart sounds: No murmur heard. Pulmonary:     Effort: Pulmonary effort is normal. No respiratory distress.     Breath sounds: Normal breath sounds.  Abdominal:     Palpations: Abdomen is soft.     Tenderness: There is no abdominal tenderness.  Musculoskeletal:        General: No swelling.     Cervical back: Neck supple.     Comments: Patient with left index finger slightly flexed at PIP joint with inability to internally  extend.  Able to flex digit fully.  Overlying swelling of said joint.  No obvious erythema, palpable fluctuance/induration.  Capillary fill less than 2 seconds.  Skin:    General: Skin is warm and dry.     Capillary Refill: Capillary refill takes less than 2 seconds.  Neurological:     Mental Status: He is alert.  Psychiatric:        Mood and Affect: Mood normal.     ED Results / Procedures / Treatments   Labs (all labs ordered are listed, but only abnormal results are displayed) Labs Reviewed - No data to display  EKG None  Radiology DG Finger Index Left Result Date: 02/07/2023 CLINICAL DATA:  Left index finger pain over the last 3 weeks, basketball injury EXAM: LEFT INDEX FINGER 2+V COMPARISON:  Report from left hand radiographs dated 03/24/2002 FINDINGS: The proximal interphalangeal joint is flexed and the distal interphalangeal joint is extended, raising the possibility of a mild boutonniere deformity. No visible fracture or foreign body. IMPRESSION: 1. 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 No visible fracture or foreign body. Electronically Signed   By: Ryan Salvage M.D.   On: 02/07/2023 16:26    Procedures Procedures    Medications Ordered in ED Medications -  No data to display  ED Course/ Medical Decision Making/ A&P                                 Medical Decision Making Amount and/or Complexity of Data Reviewed Radiology: ordered.   This patient presents to the ED for concern of finger pain, this involves an extensive number of treatment options, and is a complaint that carries with it a high risk of complications and morbidity.  The differential diagnosis includes fracture, strain/pain, dislocation, ligamentous/tendinous injury, neurovascular compromise, septic arthritis, OA, other   Co morbidities that complicate the patient evaluation  See HPI   Additional history obtained:  Additional history obtained from EMR External  records from outside source obtained and reviewed including hospital   Lab Tests:  N/a   Imaging Studies ordered:  I ordered imaging studies including left finger x-ray. I independently visualized and interpreted imaging which showed no obvious fracture or dislocation I agree with the radiologist interpretation   Cardiac Monitoring: / EKG:  The patient was maintained on a cardiac monitor.  I personally viewed and interpreted the cardiac monitored which showed an underlying rhythm of: Sinus rhythm   Consultations Obtained:  N/a   Problem List / ED Course / Critical interventions / Medication management  Left finger pain Reevaluation of the patient showed that the patient stayed the same I have reviewed the patients home medicines and have made adjustments as needed   Social Determinants of Health:  Denies tobacco, illicit drug use   Test / Admission - Considered:  Left finger pain Vitals signs within normal range and stable throughout visit. Imaging studies significant for: See above 28 year old male presents emergency department with complaints of left index finger pain after jamming his finger against a basketball 2 to 3 weeks ago.  On exam, obvious boutonniere deformity with tenderness of PIP of affected digit with inability to completely extend finger.  No overlying skin changes concerning for secondary infectious process.  Concern the patient suffered traumatic central slip tendon injury of digit.  X-ray without any obvious fracture or dislocation with concern for boutonniere deformity.  Placed in finger splint will recommend with follow-up with hand specialist in the outpatient setting.  Treatment plan discussed length with and he acknowledged understanding was agreeable to said plan.  Patient overall well-appearing, afebrile in no acute distress. Worrisome signs and symptoms were discussed with the patient, and the patient acknowledged understanding to return to the  ED if noticed. Patient was stable upon discharge.          Final Clinical Impression(s) / ED Diagnoses Final diagnoses:  Finger pain, left    Rx / DC Orders ED Discharge Orders     None         Silver Wonda LABOR, [place] 02/07/23 TYRA Jerral Meth, MD 02/07/23 [phone]

## 2023-02-07 NOTE — ED Triage Notes (Signed)
 C/o left index finger pain x 3 weeks. Injured while playing basketball. Does not appear swollen/discolored.

## 2023-02-07 NOTE — Discharge Instructions (Addendum)
 As discussed, concern for tendon injury given that your x-ray appears normal.  Recommend use of anti-inflammatories as well as icing area for treatment of pain.  Wear splint to help with straightening out of your finger.  Call number attached to discharge papers to schedule appointment with the hand specialist.  Please do not hesitate to return if the worrisome signs and symptoms we discussed become apparent.

## 2023-02-07 NOTE — ED Notes (Signed)
 RN reviewed discharge instructions with pt. Pt verbalized understanding and had no further questions. VSS upon discharge.

## 2023-03-05 ENCOUNTER — Encounter (HOSPITAL_BASED_OUTPATIENT_CLINIC_OR_DEPARTMENT_OTHER): Payer: Self-pay

## 2023-03-05 ENCOUNTER — Other Ambulatory Visit: Payer: Self-pay

## 2023-03-05 ENCOUNTER — Emergency Department (HOSPITAL_BASED_OUTPATIENT_CLINIC_OR_DEPARTMENT_OTHER)
Admission: EM | Admit: 2023-03-05 | Discharge: 2023-03-05 | Discharge status: Against Medical Advice | Payer: Self-pay | Attending: Emergency Medicine | Admitting: Emergency Medicine

## 2023-03-05 DIAGNOSIS — R509 Fever, unspecified: Secondary | ICD-10-CM | POA: Insufficient documentation

## 2023-03-05 DIAGNOSIS — R059 Cough, unspecified: Secondary | ICD-10-CM | POA: Insufficient documentation

## 2023-03-05 DIAGNOSIS — Z5321 Procedure and treatment not carried out due to patient leaving prior to being seen by health care provider: Secondary | ICD-10-CM | POA: Insufficient documentation

## 2023-03-05 MED ORDER — ACETAMINOPHEN 325 MG PO TABS
ORAL_TABLET | ORAL | Status: AC
  Filled 2023-03-05: qty 1

## 2023-03-05 MED ORDER — ACETAMINOPHEN 325 MG PO TABS
650.0000 mg | ORAL_TABLET | Freq: Once | ORAL | Status: AC | PRN
  Administered 2023-03-05: 650 mg via ORAL
  Filled 2023-03-05: qty 2

## 2023-03-05 NOTE — ED Triage Notes (Signed)
Patient tested positive for the Flu+ today. Started cough 2-3 days ago. Fever. Took mucinex last pm.

## 2023-10-21 ENCOUNTER — Other Ambulatory Visit: Payer: Self-pay

## 2023-10-21 ENCOUNTER — Emergency Department (HOSPITAL_BASED_OUTPATIENT_CLINIC_OR_DEPARTMENT_OTHER)
Admission: EM | Admit: 2023-10-21 | Discharge: 2023-10-21 | Disposition: A | Discharge status: Home / Self Care | Payer: Self-pay | Attending: Emergency Medicine | Admitting: Emergency Medicine

## 2023-10-21 ENCOUNTER — Other Ambulatory Visit (HOSPITAL_BASED_OUTPATIENT_CLINIC_OR_DEPARTMENT_OTHER): Payer: Self-pay

## 2023-10-21 ENCOUNTER — Encounter (HOSPITAL_BASED_OUTPATIENT_CLINIC_OR_DEPARTMENT_OTHER): Payer: Self-pay | Admitting: Emergency Medicine

## 2023-10-21 DIAGNOSIS — R109 Unspecified abdominal pain: Secondary | ICD-10-CM

## 2023-10-21 DIAGNOSIS — R1010 Upper abdominal pain, unspecified: Secondary | ICD-10-CM | POA: Insufficient documentation

## 2023-10-21 MED ORDER — ACETAMINOPHEN 500 MG PO TABS
1000.0000 mg | ORAL_TABLET | Freq: Once | ORAL | Status: AC
  Administered 2023-10-21: 1000 mg via ORAL
  Filled 2023-10-21: qty 2

## 2023-10-21 MED ORDER — OMEPRAZOLE 20 MG PO CPDR
20.0000 mg | DELAYED_RELEASE_CAPSULE | Freq: Every day | ORAL | 0 refills | Status: AC
  Filled 2023-10-21: qty 30, 30d supply, fill #0

## 2023-10-21 MED ORDER — ALUM & MAG HYDROXIDE-SIMETH 200-200-20 MG/5ML PO SUSP
30.0000 mL | Freq: Once | ORAL | Status: AC
  Administered 2023-10-21: 30 mL via ORAL
  Filled 2023-10-21: qty 30

## 2023-10-21 MED ORDER — FAMOTIDINE 20 MG PO TABS
20.0000 mg | ORAL_TABLET | Freq: Once | ORAL | Status: AC
  Administered 2023-10-21: 20 mg via ORAL
  Filled 2023-10-21: qty 1

## 2023-10-21 NOTE — ED Provider Notes (Signed)
 [place] EMERGENCY DEPARTMENT AT [facility] Provider Note   CSN: [id number] Arrival date & time: 10/21/23  9140     Patient presents with: Abdominal Pain  HPI Todd Mcguire is a 28 y.o. male presenting for abdominal pain.  Has been going on for about a month and possibly worse in the last 2 weeks.  Pain is primarily in the upper abdomen.  States he has been having issues with reflux and belching especially after meals.  Denies nausea vomiting diarrhea.  Normal bowel movement yesterday.  Denies chest pain or shortness of breath.  His mom advised him to come in thinking maybe he had a umbilical hernia since he has been doing some heavy lifting at the gym and does have some periumbilical pain also at times.  Still some gas.    Abdominal Pain      Prior to Admission medications   Medication Sig Start Date End Date Taking? Authorizing Provider  omeprazole  (PRILOSEC) 20 MG capsule Take 1 capsule (20 mg total) by mouth daily. 10/21/23 11/20/23 Yes Ayman Brull K, PA-C    Allergies: Cephalexin, Flexeril  [cyclobenzaprine ], and Ibuprofen     Review of Systems  Gastrointestinal:  Positive for abdominal pain.    Updated Vital Signs BP 133/67 (BP Location: Right Arm)   Pulse (!) 55   Temp 98 F (36.7 C)   Resp 15   Ht 5' 10 (1.778 m)   Wt 79.4 kg   SpO2 100%   BMI 25.11 kg/m   Physical Exam Vitals and nursing note reviewed.  HENT:     Head: Normocephalic and atraumatic.     Mouth/Throat:     Mouth: Mucous membranes are moist.  Eyes:     General:        Right eye: No discharge.        Left eye: No discharge.     Conjunctiva/sclera: Conjunctivae normal.  Cardiovascular:     Rate and Rhythm: Normal rate and regular rhythm.     Pulses: Normal pulses.     Heart sounds: Normal heart sounds.  Pulmonary:     Effort: Pulmonary effort is normal.     Breath sounds: Normal breath sounds.  Abdominal:     General: Abdomen is flat. There is no distension.      Palpations: Abdomen is soft.     Tenderness: There is no abdominal tenderness.   Skin:    General: Skin is warm and dry.  Neurological:     General: No focal deficit present.  Psychiatric:        Mood and Affect: Mood normal.     (all labs ordered are listed, but only abnormal results are displayed) Labs Reviewed - No data to display  EKG: None  Radiology: No results found.   Procedures   Medications Ordered in the ED  alum & mag hydroxide-simeth (MAALOX/MYLANTA) 200-200-20 MG/5ML suspension 30 mL (has no administration in time range)  famotidine  (PEPCID ) tablet 20 mg (has no administration in time range)  acetaminophen  (TYLENOL ) tablet 1,000 mg (has no administration in time range)                                    Medical Decision Making Risk OTC drugs.   28 year old well-appearing male presenting for abdominal pain.  Exam was unremarkable, abdomen is soft and nontender.  It is possible that he has a very tiny umbilical hernia but workup  and clinical findings does not suggest strangulation or incarceration.  Considered other acute intra-abdominal pathology but unlikely given lack of symptoms and benign exam.  It is possible that he is having issues with reflux.  Given GI cocktail and Pepcid  here.  Sent omeprazole  to his pharmacy and advised him to follow-up with his PCP.  Discussed return precautions.  Discharged good condition.     Final diagnoses:  Abdominal pain, unspecified abdominal location    ED Discharge Orders          Ordered    omeprazole  (PRILOSEC) 20 MG capsule  Daily        10/21/23 1006               Lang Norleen POUR, PA-C 10/21/23 1007    Zackowski, Scott, MD 10/22/23 1531

## 2023-10-21 NOTE — ED Triage Notes (Addendum)
 Pt c/o abd pain at umbilicus x 1 month, worsening over past 2 weeks, reports some straining for work with heavy lifting.Pt adds concern for umbilical hernia

## 2023-10-21 NOTE — Discharge Instructions (Signed)
 Evaluation today was overall reassuring.  As we discussed I am going to start you on omeprazole  which will help with your reflux symptoms at home.  Please take it for the next couple of weeks and then follow-up with your primary care doctor to see how you are doing.  If you develop nausea vomiting diarrhea, fever, worsening abdominal pain or any other concerning symptom please return to the ED for further evaluation.
